# Patient Record
Sex: Male | Born: 1980 | Race: White | Hispanic: No | Marital: Single | Smoking: Current every day smoker
Health system: Southern US, Community
[De-identification: ages and names within clinical notes are randomized; demographics above are authoritative.]

## PROBLEM LIST (undated history)

## (undated) DIAGNOSIS — T50901A Poisoning by unspecified drugs, medicaments and biological substances, accidental (unintentional), initial encounter: Secondary | ICD-10-CM

## (undated) DIAGNOSIS — F1910 Other psychoactive substance abuse, uncomplicated: Secondary | ICD-10-CM

## (undated) DIAGNOSIS — F419 Anxiety disorder, unspecified: Secondary | ICD-10-CM

## (undated) DIAGNOSIS — F32A Depression, unspecified: Secondary | ICD-10-CM

## (undated) DIAGNOSIS — F329 Major depressive disorder, single episode, unspecified: Secondary | ICD-10-CM

## (undated) SURGERY — Surgical Case
Anesthesia: *Unknown

---

## 2010-03-28 ENCOUNTER — Emergency Department (HOSPITAL_COMMUNITY): Admission: EM | Admit: 2010-03-28 | Discharge: 2010-03-28 | Payer: Self-pay | Admitting: Emergency Medicine

## 2011-08-25 ENCOUNTER — Encounter (HOSPITAL_COMMUNITY): Payer: Self-pay

## 2011-08-25 ENCOUNTER — Emergency Department (HOSPITAL_COMMUNITY)
Admission: EM | Admit: 2011-08-25 | Discharge: 2011-08-25 | Disposition: A | Discharge status: Home / Self Care | Payer: Self-pay | Attending: Emergency Medicine | Admitting: Emergency Medicine

## 2011-08-25 DIAGNOSIS — K029 Dental caries, unspecified: Secondary | ICD-10-CM | POA: Insufficient documentation

## 2011-08-25 DIAGNOSIS — K0889 Other specified disorders of teeth and supporting structures: Secondary | ICD-10-CM

## 2011-08-25 DIAGNOSIS — K089 Disorder of teeth and supporting structures, unspecified: Secondary | ICD-10-CM | POA: Insufficient documentation

## 2011-08-25 MED ORDER — PENICILLIN V POTASSIUM 250 MG PO TABS
500.0000 mg | ORAL_TABLET | Freq: Once | ORAL | Status: AC
  Administered 2011-08-25: 500 mg via ORAL
  Filled 2011-08-25: qty 2

## 2011-08-25 MED ORDER — PENICILLIN V POTASSIUM 500 MG PO TABS: 500.0000 mg | ORAL_TABLET | Freq: Four times a day (QID) | ORAL | Status: AC

## 2011-08-25 MED ORDER — HYDROCODONE-ACETAMINOPHEN 5-325 MG PO TABS: ORAL_TABLET | ORAL | Status: AC

## 2011-08-25 MED ORDER — OXYCODONE-ACETAMINOPHEN 5-325 MG PO TABS
1.0000 | ORAL_TABLET | Freq: Once | ORAL | Status: AC
  Administered 2011-08-25: 1 via ORAL
  Filled 2011-08-25: qty 1

## 2011-08-25 NOTE — ED Provider Notes (Signed)
History     CSN: [id number]  Arrival date & time 08/25/11  0016   First MD Initiated Contact with Patient 08/25/11 0034      Chief Complaint  Patient presents with  . Dental Pain    (Consider location/radiation/quality/duration/timing/severity/associated sxs/prior treatment) HPI Comments: Patient complains of worsening dental pain for 3-4 days. States the pain is keeping him from sleep. Pain is worse to the upper left front teeth.  He denies fever, neck pain, headache or dizziness.    Patient is a 31 y.o. male presenting with tooth pain. The history is provided by the patient. No language interpreter was used.  Dental PainThe primary symptoms include mouth pain and headaches. Primary symptoms do not include dental injury, oral bleeding, fever, shortness of breath, sore throat, angioedema or cough. The symptoms began 3 to 5 days ago. The symptoms are worsening. The symptoms are recurrent. The symptoms occur constantly.  Mouth pain occurs constantly. Mouth pain is worsening. Affected locations include: gum(s) and teeth. The mouth pain is currently at 10/10.  The headache is not associated with neck stiffness or weakness.   Additional symptoms include: dental sensitivity to temperature, gum swelling, gum tenderness, ear pain and swollen glands. Additional symptoms do not include: purulent gums, trismus, facial swelling, trouble swallowing and pain with swallowing. Medical issues include: smoking and periodontal disease.    History reviewed. No pertinent past medical history.  History reviewed. No pertinent past surgical history.  History reviewed. No pertinent family history.  History  Substance Use Topics  . Smoking status: Current Everyday Smoker  . Smokeless tobacco: Not on file  . Alcohol Use: No      Review of Systems  Constitutional: Negative for fever, activity change and appetite change.  HENT: Positive for ear pain and dental problem. Negative for sore throat, facial  swelling, trouble swallowing, neck pain and neck stiffness.   Respiratory: Negative for cough and shortness of breath.   Gastrointestinal: Negative for vomiting.  Musculoskeletal: Negative.   Skin: Negative.   Neurological: Positive for headaches. Negative for dizziness and weakness.  Hematological: Positive for adenopathy.  All other systems reviewed and are negative.    Allergies  Review of patient's allergies indicates no known allergies.  Home Medications   Current Outpatient Rx  Name Route Sig Dispense Refill  . IBUPROFEN 800 MG PO TABS Oral Take 800 mg by mouth every 8 (eight) hours as needed.      BP 159/80  Pulse 115  Temp(Src) 97.9 F (36.6 C) (Oral)  Resp 14  SpO2 100%  Physical Exam  Nursing note and vitals reviewed. Constitutional: He is oriented to person, place, and time. He appears well-developed and well-nourished. No distress.  HENT:  Head: Normocephalic and atraumatic. No trismus in the jaw.  Mouth/Throat: Uvula is midline, oropharynx is clear and moist and mucous membranes are normal. He does not have dentures. No oral lesions. Abnormal dentition. Dental caries present. No dental abscesses, uvula swelling or lacerations. No oropharyngeal exudate.         Widespread dental disease and multiple dental caries of the upper teeth.  No obvious abscess.  Eyes: EOM are normal. Pupils are equal, round, and reactive to light.  Neck: Normal range of motion. Neck supple. No thyromegaly present.  Cardiovascular: Normal rate, regular rhythm and normal heart sounds.   Pulmonary/Chest: Effort normal and breath sounds normal.  Lymphadenopathy:    He has no cervical adenopathy.  Neurological: He is alert and oriented to person, place, and  time.  Skin: Skin is warm and dry.    ED Course  Procedures (including critical care time)       MDM    Patient has multiple dental caries. Widespread dental decay.  No obvious dental abscess, no trismus.  Non-toxic  appearing.  He agrees to close f/u with a dentist.        Arlyn Bumpus L. Amorie Rentz, PA 08/25/11 0100

## 2011-08-25 NOTE — ED Notes (Signed)
Pt stable at discharge with family discussed community resources

## 2011-08-25 NOTE — ED Notes (Signed)
Pt states pain bilateral sides left side worst than right side; pain radiates up to forehead causing a margine headache. Pain increased this pm. Pt has not been able to sleep in 48 hours. Pt states is going to seek dental treatment next month

## 2011-08-25 NOTE — ED Notes (Signed)
Multiple dental decay, states more painful to right upper, is taking ibuprofen for same without relief

## 2011-08-25 NOTE — ED Provider Notes (Signed)
Medical screening examination/treatment/procedure(s) were performed by non-physician practitioner and as supervising physician I was immediately available for consultation/collaboration.  Donnetta Hutching, MD 08/25/11 [phone]

## 2013-03-05 ENCOUNTER — Emergency Department (HOSPITAL_COMMUNITY)
Admission: EM | Admit: 2013-03-05 | Discharge: 2013-03-05 | Disposition: A | Discharge status: Home / Self Care | Payer: Self-pay | Attending: Emergency Medicine | Admitting: Emergency Medicine

## 2013-03-05 ENCOUNTER — Encounter (HOSPITAL_COMMUNITY): Payer: Self-pay | Admitting: *Deleted

## 2013-03-05 DIAGNOSIS — H53149 Visual discomfort, unspecified: Secondary | ICD-10-CM | POA: Insufficient documentation

## 2013-03-05 DIAGNOSIS — F17200 Nicotine dependence, unspecified, uncomplicated: Secondary | ICD-10-CM | POA: Insufficient documentation

## 2013-03-05 DIAGNOSIS — H16001 Unspecified corneal ulcer, right eye: Secondary | ICD-10-CM

## 2013-03-05 DIAGNOSIS — H16009 Unspecified corneal ulcer, unspecified eye: Secondary | ICD-10-CM | POA: Insufficient documentation

## 2013-03-05 DIAGNOSIS — K029 Dental caries, unspecified: Secondary | ICD-10-CM | POA: Insufficient documentation

## 2013-03-05 DIAGNOSIS — H5789 Other specified disorders of eye and adnexa: Secondary | ICD-10-CM | POA: Insufficient documentation

## 2013-03-05 DIAGNOSIS — H539 Unspecified visual disturbance: Secondary | ICD-10-CM | POA: Insufficient documentation

## 2013-03-05 MED ORDER — CYCLOPENTOLATE HCL 1 % OP SOLN
1.0000 [drp] | Freq: Once | OPHTHALMIC | Status: DC
  Filled 2013-03-05: qty 2

## 2013-03-05 MED ORDER — GATIFLOXACIN 0.5 % OP SOLN
1.0000 [drp] | Freq: Once | OPHTHALMIC | Status: AC
  Administered 2013-03-05: 1 [drp] via OPHTHALMIC
  Filled 2013-03-05: qty 2.5

## 2013-03-05 MED ORDER — CYCLOPENTOLATE HCL 1 % OP SOLN: 1.0000 [drp] | Freq: Four times a day (QID) | OPHTHALMIC | Status: DC

## 2013-03-05 MED ORDER — CYCLOPENTOLATE-PHENYLEPHRINE 0.2-1 % OP SOLN
OPHTHALMIC | Status: AC
  Filled 2013-03-05: qty 2

## 2013-03-05 MED ORDER — AMOXICILLIN 500 MG PO CAPS: 500.0000 mg | ORAL_CAPSULE | Freq: Three times a day (TID) | ORAL | Status: DC

## 2013-03-05 MED ORDER — KETOROLAC TROMETHAMINE 0.5 % OP SOLN
1.0000 [drp] | Freq: Once | OPHTHALMIC | Status: AC
  Administered 2013-03-05: 1 [drp] via OPHTHALMIC
  Filled 2013-03-05: qty 5

## 2013-03-05 MED ORDER — GATIFLOXACIN 0.5 % OP SOLN
OPHTHALMIC | Status: AC
  Filled 2013-03-05: qty 2.5

## 2013-03-05 MED ORDER — TETRACAINE HCL 0.5 % OP SOLN
2.0000 [drp] | Freq: Once | OPHTHALMIC | Status: AC
  Administered 2013-03-05: 2 [drp] via OPHTHALMIC
  Filled 2013-03-05: qty 2

## 2013-03-05 NOTE — ED Provider Notes (Signed)
Jeffrey Schultz is a 32 y.o. male here for evaluation of right eye discomfort. He had a contact in his right eye that has been present for 9 months, he forgotten about. He feels better after he removed it, in emergency department. After removal of the contact, is a small area of uptake and 8:00 in the periphery of the cornea of the right eye. Is mild scleral injection. Pupillary function is normal and there is no hyphema.  Assessment: Prolonged contact exposure right eye. Possible small corneal ulcer. It may improve simply with simply having removed the contact. We will contact an ophthalmologist for guidance in treatment, and to arrange followup care.  Medical screening examination/treatment/procedure(s) were conducted as a shared visit with non-physician practitioner(s) and myself.  I personally evaluated the patient during the encounter  Flint Melter, MD 03/09/13 [phone]

## 2013-03-05 NOTE — ED Notes (Signed)
Pt c/o abscess-like area to roof of mouth as well as headache and right eye pain. Pt states he noticed the area on the roof of his mouth approximately 1 week ago. Pt states headache and eye pain began yesterday. Pt reports blurry vision and burning pain in right eye.

## 2013-03-05 NOTE — ED Notes (Signed)
Pt does have contact in lt eye. Rajvi Armentor

## 2013-03-05 NOTE — ED Provider Notes (Signed)
CSN: [id number]     Arrival date & time 03/05/13  1528 History     First MD Initiated Contact with Patient 03/05/13 1631     Chief Complaint  Patient presents with  . Eye Pain   (Consider location/radiation/quality/duration/timing/severity/associated sxs/prior Treatment) Patient is a 32 y.o. male presenting with eye pain. The history is provided by the patient.  Eye Pain This is a new problem. The current episode started in the past 7 days. The problem occurs constantly. The problem has been gradually worsening. Associated symptoms include a visual change. Pertinent negatives include no abdominal pain, chest pain, chills, congestion, coughing, fever, headaches, myalgias, nausea, neck pain, numbness, rash, sore throat, swollen glands, urinary symptoms, vertigo, vomiting or weakness. Associated symptoms comments: Dental pain for several days. Exacerbated by: bright lights. He has tried nothing for the symptoms. The treatment provided no relief.    History reviewed. No pertinent past medical history. History reviewed. No pertinent past surgical history. No family history on file. History  Substance Use Topics  . Smoking status: Current Every Day Smoker  . Smokeless tobacco: Not on file  . Alcohol Use: No    Review of Systems  Constitutional: Negative for fever, chills and appetite change.  HENT: Positive for dental problem. Negative for congestion, sore throat, facial swelling, trouble swallowing, neck pain and neck stiffness.   Eyes: Positive for photophobia, pain, redness and visual disturbance. Negative for discharge and itching.  Respiratory: Negative for cough.   Cardiovascular: Negative for chest pain.  Gastrointestinal: Negative for nausea, vomiting and abdominal pain.  Musculoskeletal: Negative for myalgias.  Skin: Negative for rash.  Neurological: Negative for dizziness, vertigo, facial asymmetry, weakness, numbness and headaches.  Hematological: Negative for adenopathy.   All other systems reviewed and are negative.    Allergies  Review of patient's allergies indicates no known allergies.  Home Medications   Current Outpatient Rx  Name  Route  Sig  Dispense  Refill  . ibuprofen (ADVIL,MOTRIN) 200 MG tablet   Oral   Take 800 mg by mouth every 6 (six) hours as needed for pain.          BP 156/90  Pulse 75  Temp(Src) 98.2 F (36.8 C) (Oral)  Resp 20  Ht 5\' 8"  (1.727 m)  Wt 155 lb (70.308 kg)  BMI 23.57 kg/m2  SpO2 100% Physical Exam  Nursing note and vitals reviewed. Constitutional: He is oriented to person, place, and time. He appears well-developed and well-nourished. No distress.  HENT:  Head: Normocephalic and atraumatic. No trismus in the jaw.  Right Ear: Tympanic membrane and ear canal normal.  Left Ear: Tympanic membrane and ear canal normal.  Mouth/Throat: Uvula is midline, oropharynx is clear and moist and mucous membranes are normal. Dental caries present. No dental abscesses or edematous.  Multiple dental caries of the upper teeth.  Most teeth have decayed to the gumline.  No obvious dental abscess, trismus or facial edema.  Eyes: EOM are normal. Pupils are equal, round, and reactive to light. No foreign bodies found. Left eye exhibits no chemosis, no discharge, no exudate and no hordeolum. Left conjunctiva is injected.  Slit lamp exam:      The right eye shows fluorescein uptake.       The left eye shows corneal ulcer. The left eye shows no corneal abrasion and no hyphema.  fluorescein uptake of the entire cornea.  Very distinct margins surrounding the iris that appears c/w the presence of a contact lens.  Once  contact was removed, pt has a small corneal ulcer to the 12 o'clock position.    Neck: Normal range of motion. Neck supple.  Cardiovascular: Normal rate, regular rhythm and normal heart sounds.   No murmur heard. Pulmonary/Chest: Effort normal and breath sounds normal.  Musculoskeletal: Normal range of motion.   Lymphadenopathy:    He has no cervical adenopathy.  Neurological: He is alert and oriented to person, place, and time. He exhibits normal muscle tone. Coordination normal.  Skin: Skin is warm and dry.    ED Course   Procedures (including critical care time)  Labs Reviewed - No data to display No results found. No diagnosis found.  MDM   Visual acuity attempted prior to my exam, pt unable to visualize anything in the right eye due to pain and could not keep his eye open.   Pt also seen by the EDP and care plan discussed.  1850  Consulted Dr. Lita Mains.  Recommends acular, cylogyl, and gatifiloxacin.  Will see tomorrow in his office for recheck.  Pt is feeling better and appears stable for d/c and agrees to the care plan and verbalized understanding  Christinea Brizuela L. Jerrika Ledlow, PA-C 03/09/13 1600

## 2013-03-05 NOTE — ED Notes (Signed)
States he has an abscess in right side of mouth, states yesterday his right eye started hurting, today he cannot see out of eye due to pain , sclera is red

## 2013-03-06 NOTE — Progress Notes (Signed)
   CARE MANAGEMENT ED NOTE 03/06/2013  Patient:  Jeffrey Schultz, Jeffrey Schultz   Account Number:  [id number]  Date Initiated:  03/05/2013  Documentation initiated by:  Anibal Henderson  Subjective/Objective Assessment:   ED/CM noted pt did not have PCP, nor insurance     Subjective/Objective Assessment Detail:   Spoke with pt and he confirmed no PCP, no insurance. Handouts give with [facility] information for clinics, food pantries, and information on obtaining insurance     Action/Plan:   Action/Plan Detail:   Anticipated DC Date:  03/05/2013     Status Recommendation to Physician:   Result of Recommendation:        Choice offered to / List presented to:            Status of service:    ED Comments:   ED Comments Detail:     CARE MANAGEMENT ED NOTE 03/06/2013  Patient:  Jeffrey Schultz, Jeffrey Schultz   Account Number:  [id number]  Date Initiated:  03/05/2013  Documentation initiated by:  Anibal Henderson  Subjective/Objective Assessment:   ED/CM noted pt did not have PCP, nor insurance     Subjective/Objective Assessment Detail:   Spoke with pt and he confirmed no PCP, no insurance. Handouts give with [facility] information for clinics, food pantries, and information on obtaining insurance     Action/Plan:   Action/Plan Detail:   Anticipated DC Date:  03/05/2013     Status Recommendation to Physician:   Result of Recommendation:        Choice offered to / List presented to:            Status of service:    ED Comments:   ED Comments Detail:

## 2013-03-09 NOTE — ED Provider Notes (Signed)
Medical screening examination/treatment/procedure(s) were performed by non-physician practitioner and as supervising physician I was immediately available for consultation/collaboration.  Flint Melter, MD 03/09/13 Zollie Pee

## 2014-04-22 ENCOUNTER — Emergency Department (HOSPITAL_COMMUNITY)
Admission: EM | Admit: 2014-04-22 | Discharge: 2014-04-22 | Disposition: A | Discharge status: Home / Self Care | Payer: Self-pay | Attending: Emergency Medicine | Admitting: Emergency Medicine

## 2014-04-22 ENCOUNTER — Encounter (HOSPITAL_COMMUNITY): Payer: Self-pay | Admitting: Emergency Medicine

## 2014-04-22 DIAGNOSIS — K006 Disturbances in tooth eruption: Secondary | ICD-10-CM | POA: Insufficient documentation

## 2014-04-22 DIAGNOSIS — F17200 Nicotine dependence, unspecified, uncomplicated: Secondary | ICD-10-CM | POA: Insufficient documentation

## 2014-04-22 DIAGNOSIS — K089 Disorder of teeth and supporting structures, unspecified: Secondary | ICD-10-CM | POA: Insufficient documentation

## 2014-04-22 DIAGNOSIS — Z79899 Other long term (current) drug therapy: Secondary | ICD-10-CM | POA: Insufficient documentation

## 2014-04-22 DIAGNOSIS — R599 Enlarged lymph nodes, unspecified: Secondary | ICD-10-CM | POA: Insufficient documentation

## 2014-04-22 DIAGNOSIS — R51 Headache: Secondary | ICD-10-CM | POA: Insufficient documentation

## 2014-04-22 DIAGNOSIS — K029 Dental caries, unspecified: Secondary | ICD-10-CM | POA: Insufficient documentation

## 2014-04-22 DIAGNOSIS — Z792 Long term (current) use of antibiotics: Secondary | ICD-10-CM | POA: Insufficient documentation

## 2014-04-22 DIAGNOSIS — R110 Nausea: Secondary | ICD-10-CM | POA: Insufficient documentation

## 2014-04-22 MED ORDER — ACETAMINOPHEN-CODEINE #3 300-30 MG PO TABS: 1.0000 | ORAL_TABLET | Freq: Four times a day (QID) | ORAL | Status: DC | PRN

## 2014-04-22 MED ORDER — CEFTRIAXONE SODIUM 1 G IJ SOLR
1.0000 g | Freq: Once | INTRAMUSCULAR | Status: AC
  Administered 2014-04-22: 1 g via INTRAMUSCULAR
  Filled 2014-04-22: qty 10

## 2014-04-22 MED ORDER — CLINDAMYCIN HCL 150 MG PO CAPS: ORAL_CAPSULE | ORAL | Status: DC

## 2014-04-22 MED ORDER — ACETAMINOPHEN-CODEINE #3 300-30 MG PO TABS
2.0000 | ORAL_TABLET | Freq: Once | ORAL | Status: AC
  Administered 2014-04-22: 2 via ORAL
  Filled 2014-04-22: qty 2

## 2014-04-22 MED ORDER — LIDOCAINE HCL (PF) 1 % IJ SOLN
INTRAMUSCULAR | Status: AC
  Filled 2014-04-22: qty 5

## 2014-04-22 MED ORDER — ONDANSETRON 4 MG PO TBDP
4.0000 mg | ORAL_TABLET | Freq: Once | ORAL | Status: AC
  Administered 2014-04-22: 4 mg via ORAL
  Filled 2014-04-22: qty 1

## 2014-04-22 NOTE — Discharge Instructions (Signed)
Advanced dental disease and multiple infected teeth. It is extremely important that you see a dentist as sone as possible. Please use clindamycin 4 times daily with each meal and at bedtime. Use Tylenol for mild pain, use Tylenol codeine for more severe pain. Tylenol codeine may cause drowsiness, please use with caution. Dental Caries Dental caries (also called tooth decay) is the most common oral disease. It can occur at any age but is more common in children and young adults.  HOW DENTAL CARIES DEVELOPS  The process of decay begins when bacteria and foods (particularly sugars and starches) combine in your mouth to produce plaque. Plaque is a substance that sticks to the hard, outer surface of a tooth (enamel). The bacteria in plaque produce acids that attack enamel. These acids may also attack the root surface of a tooth (cementum) if it is exposed. Repeated attacks dissolve these surfaces and create holes in the tooth (cavities). If left untreated, the acids destroy the other layers of the tooth.  RISK FACTORS  Frequent sipping of sugary beverages.   Frequent snacking on sugary and starchy foods, especially those that easily get stuck in the teeth.   Poor oral hygiene.   Dry mouth.   Substance abuse such as methamphetamine abuse.   Broken or poor-fitting dental restorations.   Eating disorders.   Gastroesophageal reflux disease (GERD).   Certain radiation treatments to the head and neck. SYMPTOMS In the early stages of dental caries, symptoms are seldom present. Sometimes white, chalky areas may be seen on the enamel or other tooth layers. In later stages, symptoms may include:  Pits and holes on the enamel.  Toothache after sweet, hot, or cold foods or drinks are consumed.  Pain around the tooth.  Swelling around the tooth. DIAGNOSIS  Most of the time, dental caries is detected during a regular dental checkup. A diagnosis is made after a thorough medical and dental  history is taken and the surfaces of your teeth are checked for signs of dental caries. Sometimes special instruments, such as lasers, are used to check for dental caries. Dental X-ray exams may be taken so that areas not visible to the eye (such as between the contact areas of the teeth) can be checked for cavities.  TREATMENT  If dental caries is in its early stages, it may be reversed with a fluoride treatment or an application of a remineralizing agent at the dental office. Thorough brushing and flossing at home is needed to aid these treatments. If it is in its later stages, treatment depends on the location and extent of tooth destruction:   If a small area of the tooth has been destroyed, the destroyed area will be removed and cavities will be filled with a material such as gold, silver amalgam, or composite resin.   If a large area of the tooth has been destroyed, the destroyed area will be removed and a cap (crown) will be fitted over the remaining tooth structure.   If the center part of the tooth (pulp) is affected, a procedure called a root canal will be needed before a filling or crown can be placed.   If most of the tooth has been destroyed, the tooth may need to be pulled (extracted). HOME CARE INSTRUCTIONS You can prevent, stop, or reverse dental caries at home by practicing good oral hygiene. Good oral hygiene includes:  Thoroughly cleaning your teeth at least twice a day with a toothbrush and dental floss.   Using a fluoride  toothpaste. A fluoride mouth rinse may also be used if recommended by your dentist or health care provider.   Restricting the amount of sugary and starchy foods and sugary liquids you consume.   Avoiding frequent snacking on these foods and sipping of these liquids.   Keeping regular visits with a dentist for checkups and cleanings. PREVENTION   Practice good oral hygiene.  Consider a dental sealant. A dental sealant is a coating material that  is applied by your dentist to the pits and grooves of teeth. The sealant prevents food from being trapped in them. It may protect the teeth for several years.  Ask about fluoride supplements if you live in a community without fluorinated water or with water that has a low fluoride content. Use fluoride supplements as directed by your dentist or health care provider.  Allow fluoride varnish applications to teeth if directed by your dentist or health care provider. Document Released: 04/09/2002 Document Revised: 12/02/2013 Document Reviewed: 07/20/2012 [facility] Patient Information 2015 [place], [place]. This information is not intended to replace advice given to you by your health care provider. Make sure you discuss any questions you have with your health care provider.

## 2014-04-22 NOTE — ED Notes (Signed)
Pt verbalized understanding of no driving and to use caution after using pain meds due to med causes drowsiness

## 2014-04-22 NOTE — ED Notes (Signed)
Dental pain for 3 days,

## 2014-04-22 NOTE — ED Provider Notes (Signed)
CSN: [id number]     Arrival date & time 04/22/14  2003 History   First MD Initiated Contact with Patient 04/22/14 2022     Chief Complaint  Patient presents with  . Dental Pain     (Consider location/radiation/quality/duration/timing/severity/associated sxs/prior Treatment) Patient is a 33 y.o. male presenting with tooth pain. The history is provided by the patient.  Dental Pain Location:  Generalized Quality:  Throbbing and aching Severity:  Severe Onset quality:  Gradual Duration:  3 days Timing:  Intermittent Progression:  Worsening Chronicity:  Chronic Context: dental caries and poor dentition   Context: not trauma   Relieved by:  Nothing Ineffective treatments:  Acetaminophen Associated symptoms: facial pain, gum swelling and headaches   Associated symptoms: no fever and no neck pain   Associated symptoms comment:  Nausea Risk factors: lack of dental care, periodontal disease and smoking   Risk factors: no diabetes     History reviewed. No pertinent past medical history. History reviewed. No pertinent past surgical history. History reviewed. No pertinent family history. History  Substance Use Topics  . Smoking status: Current Every Day Smoker  . Smokeless tobacco: Not on file  . Alcohol Use: No    Review of Systems  Constitutional: Negative for fever and activity change.       All ROS Neg except as noted in HPI  HENT: Positive for dental problem.   Eyes: Negative for photophobia and discharge.  Respiratory: Negative for cough, shortness of breath and wheezing.   Cardiovascular: Negative for chest pain and palpitations.  Gastrointestinal: Positive for nausea. Negative for abdominal pain and blood in stool.  Genitourinary: Negative for dysuria, frequency and hematuria.  Musculoskeletal: Negative for arthralgias, back pain and neck pain.  Skin: Negative.   Neurological: Positive for headaches. Negative for dizziness, seizures and speech difficulty.   Psychiatric/Behavioral: Negative for hallucinations and confusion.      Allergies  Ibuprofen  Home Medications   Prior to Admission medications   Medication Sig Start Date End Date Taking? Authorizing Provider  amoxicillin (AMOXIL) 500 MG capsule Take 1 capsule (500 mg total) by mouth 3 (three) times daily. 03/05/13   Tammy L. Triplett, PA-C  cyclopentolate (CYCLOGYL) 1 % ophthalmic solution Apply 1 drop to eye QID. 03/05/13   Tammy L. Triplett, PA-C  ibuprofen (ADVIL,MOTRIN) 200 MG tablet Take 800 mg by mouth every 6 (six) hours as needed for pain.    Historical Provider, MD   BP 127/90  Pulse 117  Temp(Src) 98.2 F (36.8 C) (Oral)  Resp 18  Ht  (1.727 m)  Wt 160 lb (72.576 kg)  BMI 24.33 kg/m2  SpO2 99% Physical Exam  Nursing note and vitals reviewed. Constitutional: He is oriented to person, place, and time. He appears well-developed and well-nourished.  Non-toxic appearance.  HENT:  Head: Normocephalic.  Right Ear: Tympanic membrane and external ear normal.  Left Ear: Tympanic membrane and external ear normal.  There multiple dental caries decayed to the gum line of the upper and lower jaw. There is swelling of the gums of the upper and lower jaw. There is no swelling of the tongue. The airway is patent. There is no swelling under the tongue.  Eyes: EOM and lids are normal. Pupils are equal, round, and reactive to light.  Neck: Normal range of motion. Neck supple. Carotid bruit is not present.  Cardiovascular: Normal rate, regular rhythm, normal heart sounds, intact distal pulses and normal pulses.   Pulmonary/Chest: Breath sounds normal. No respiratory  distress.  Abdominal: Soft. Bowel sounds are normal. There is no tenderness. There is no guarding.  Musculoskeletal: Normal range of motion.  Lymphadenopathy:       Head (right side): No submandibular adenopathy present.       Head (left side): No submandibular adenopathy present.    He has cervical adenopathy.   Neurological: He is alert and oriented to person, place, and time. He has normal strength. No cranial nerve deficit or sensory deficit.  Skin: Skin is warm and dry.  Psychiatric: He has a normal mood and affect. His speech is normal.    ED Course  Procedures (including critical care time) Labs Review Labs Reviewed - No data to display  Imaging Review No results found.   EKG Interpretation None      MDM  Patient has rather severe dental problems. Multiple cavities, multiple teeth decayed to the gum line, and advanced gum disease. Vital signs are within normal limits with exception of the pulse being elevated at 117. Have given the patient undergo resources and information, including the number to the [employer]. Rx for clindamycin and tylenol codeine given to the patient.   Final diagnoses:  None    **I have reviewed nursing notes, vital signs, and all appropriate lab and imaging results for this patient.Kathie Dike, PA-C 04/22/14 2105

## 2014-04-25 NOTE — ED Provider Notes (Signed)
Medical screening examination/treatment/procedure(s) were performed by non-physician practitioner and as supervising physician I was immediately available for consultation/collaboration.   EKG Interpretation None       Donnetta Hutching, MD 04/25/14 2010

## 2015-03-14 ENCOUNTER — Emergency Department (HOSPITAL_COMMUNITY)
Admission: EM | Admit: 2015-03-14 | Discharge: 2015-03-14 | Disposition: A | Discharge status: Home / Self Care | Payer: Self-pay | Attending: Emergency Medicine | Admitting: Emergency Medicine

## 2015-03-14 ENCOUNTER — Encounter (HOSPITAL_COMMUNITY): Payer: Self-pay

## 2015-03-14 DIAGNOSIS — Z79899 Other long term (current) drug therapy: Secondary | ICD-10-CM | POA: Insufficient documentation

## 2015-03-14 DIAGNOSIS — Z720 Tobacco use: Secondary | ICD-10-CM | POA: Insufficient documentation

## 2015-03-14 DIAGNOSIS — K047 Periapical abscess without sinus: Secondary | ICD-10-CM | POA: Insufficient documentation

## 2015-03-14 DIAGNOSIS — L120 Bullous pemphigoid: Secondary | ICD-10-CM | POA: Insufficient documentation

## 2015-03-14 DIAGNOSIS — Z792 Long term (current) use of antibiotics: Secondary | ICD-10-CM | POA: Insufficient documentation

## 2015-03-14 MED ORDER — PENICILLIN V POTASSIUM 250 MG PO TABS: 250.0000 mg | ORAL_TABLET | Freq: Four times a day (QID) | ORAL | Status: AC

## 2015-03-14 MED ORDER — TRAMADOL HCL 50 MG PO TABS: 50.0000 mg | ORAL_TABLET | Freq: Four times a day (QID) | ORAL | Status: DC | PRN

## 2015-03-14 MED ORDER — HYDROCORTISONE 0.5 % EX OINT: 1.0000 "application " | TOPICAL_OINTMENT | Freq: Two times a day (BID) | CUTANEOUS | Status: DC

## 2015-03-14 MED ORDER — HYDROCODONE-ACETAMINOPHEN 5-325 MG PO TABS
1.0000 | ORAL_TABLET | Freq: Once | ORAL | Status: AC
  Administered 2015-03-14: 1 via ORAL
  Filled 2015-03-14: qty 1

## 2015-03-14 MED ORDER — IBUPROFEN 400 MG PO TABS
600.0000 mg | ORAL_TABLET | Freq: Once | ORAL | Status: AC
  Administered 2015-03-14: 600 mg via ORAL
  Filled 2015-03-14: qty 2

## 2015-03-14 MED ORDER — IBUPROFEN 600 MG PO TABS: 600.0000 mg | ORAL_TABLET | Freq: Four times a day (QID) | ORAL | Status: DC | PRN

## 2015-03-14 NOTE — ED Provider Notes (Signed)
CSN: [id number]     Arrival date & time 03/14/15  5621 History   First MD Initiated Contact with Patient 03/14/15 [phone]     Chief Complaint  Patient presents with  . Dental Pain     (Consider location/radiation/quality/duration/timing/severity/associated sxs/prior Treatment) HPI Comments: Pt comes in with right upper quadrant toothache and skin rash. He reports hx of poor dentition and started having toothache 2 days ago. The pain is moderately severe and is radiating towards his ear. There is no neck pain, headaches, neck stiffness, fevers, chills. He has hx of dental abscess. He also reports skin rash to the hand. Pt started having blisters 3 months ago, and since then he has had persistent new lesions and old lesions breaking. No hx of skin dz, autoimmune condition.   ROS 10 Systems reviewed and are negative for acute change except as noted in the HPI.     Patient is a 34 y.o. male presenting with tooth pain. The history is provided by the patient.  Dental Pain Associated symptoms: no drooling, no facial swelling and no neck pain     History reviewed. No pertinent past medical history. History reviewed. No pertinent past surgical history. No family history on file. Social History  Substance Use Topics  . Smoking status: Current Every Day Smoker  . Smokeless tobacco: None  . Alcohol Use: No    Review of Systems  HENT: Negative for drooling and facial swelling.   Musculoskeletal: Negative for myalgias, joint swelling, arthralgias and neck pain.  Skin: Positive for rash.  Allergic/Immunologic: Negative for immunocompromised state.  All other systems reviewed and are negative.     Allergies  Review of patient's allergies indicates no active allergies.  Home Medications   Prior to Admission medications   Medication Sig Start Date End Date Taking? Authorizing Provider  acetaminophen-codeine (TYLENOL #3) 300-30 MG per tablet Take 1-2 tablets by mouth every 6 (six)  hours as needed. 04/22/14  Yes Ivery Quale, PA-C  amoxicillin (AMOXIL) 500 MG capsule Take 1 capsule (500 mg total) by mouth 3 (three) times daily. 03/05/13   Tammy Triplett, PA-C  clindamycin (CLEOCIN) 150 MG capsule 1 po ac and hs, qid 04/22/14   Ivery Quale, PA-C  cyclopentolate (CYCLOGYL) 1 % ophthalmic solution Apply 1 drop to eye QID. 03/05/13   Tammy Triplett, PA-C  hydrocortisone ointment 0.5 % Apply 1 application topically 2 (two) times daily. 03/14/15   Derwood Kaplan, MD  ibuprofen (ADVIL,MOTRIN) 600 MG tablet Take 1 tablet (600 mg total) by mouth every 6 (six) hours as needed. 03/14/15   Derwood Kaplan, MD  penicillin v potassium (VEETID) 250 MG tablet Take 1 tablet (250 mg total) by mouth 4 (four) times daily. 03/14/15 03/21/15  Derwood Kaplan, MD  traMADol (ULTRAM) 50 MG tablet Take 1 tablet (50 mg total) by mouth every 6 (six) hours as needed. 03/14/15   Amirra Herling Rhunette Croft, MD   BP 129/84 mmHg  Pulse 96  Temp(Src) 98.2 F (36.8 C) (Oral)  Resp 16  Ht  (1.727 m)  Wt 150 lb (68.04 kg)  BMI 22.81 kg/m2  SpO2 98% Physical Exam  Constitutional: He is oriented to person, place, and time. He appears well-developed.  HENT:  Head: Normocephalic and atraumatic.  Poor dentition through out with majority of tooth lost. Pt has some darkening of the gingiva around the teeth in the right quadrant, no abscess, drainage, no ANUG concerns. No trismus, no nuchal rigidity, mild cervical lymphadenopathy.  Eyes: Conjunctivae and EOM are  normal. Pupils are equal, round, and reactive to light.  Neck: Normal range of motion. Neck supple.  Cardiovascular: Normal rate and regular rhythm.   Pulmonary/Chest: Effort normal and breath sounds normal.  Abdominal: Soft. Bowel sounds are normal. He exhibits no distension. There is no tenderness. There is no rebound and no guarding.  Neurological: He is alert and oriented to person, place, and time.  Skin: Skin is warm. Rash noted.  Please see the pics - pt has  blisters to the dorsal hand.  Nursing note and vitals reviewed.         ED Course  Procedures (including critical care time) Labs Review Labs Reviewed - No data to display  Imaging Review No results found. I, Muhanad Torosyan, personally reviewed and evaluated these images and lab results as part of my medical decision-making.   EKG Interpretation None      MDM   Final diagnoses:  Bullous pemphigoid  Dental infection    Pt with very poor dentition and new skin rash. Doesn't look allergic type lesion or infected. With the combination of poor dentition and the ? Pemphigoid lesions - concerns that there might be vasculitis or autoimmune condition. Pt has no insurance, no pcp. He is to see dentist next week.  Will get him antibiotics. [facility] f.u info provided and Derm info in [place] given. Will give topical steroid.   Derwood Kaplan, MD 03/14/15 [phone]

## 2015-03-14 NOTE — Discharge Instructions (Signed)
Please see the Dentist as planned on Wednesday. Please contact the Skin Specialist as soon as possible - you need to be seen by them for further evaluation of your rash.   Dental Abscess A dental abscess is a collection of infected fluid (pus) from a bacterial infection in the inner part of the tooth (pulp). It usually occurs at the end of the tooth's root.  CAUSES   Severe tooth decay.  Trauma to the tooth that allows bacteria to enter into the pulp, such as a broken or chipped tooth. SYMPTOMS   Severe pain in and around the infected tooth.  Swelling and redness around the abscessed tooth or in the mouth or face.  Tenderness.  Pus drainage.  Bad breath.  Bitter taste in the mouth.  Difficulty swallowing.  Difficulty opening the mouth.  Nausea.  Vomiting.  Chills.  Swollen neck glands. DIAGNOSIS   A medical and dental history will be taken.  An examination will be performed by tapping on the abscessed tooth.  X-rays may be taken of the tooth to identify the abscess. TREATMENT The goal of treatment is to eliminate the infection. You may be prescribed antibiotic medicine to stop the infection from spreading. A root canal may be performed to save the tooth. If the tooth cannot be saved, it may be pulled (extracted) and the abscess may be drained.  HOME CARE INSTRUCTIONS  Only take over-the-counter or prescription medicines for pain, fever, or discomfort as directed by your caregiver.  Rinse your mouth (gargle) often with salt water ( tsp salt in 8 oz [250 ml] of warm water) to relieve pain or swelling.  Do not drive after taking pain medicine (narcotics).  Do not apply heat to the outside of your face.  Return to your dentist for further treatment as directed. SEEK MEDICAL CARE IF:  Your pain is not helped by medicine.  Your pain is getting worse instead of better. SEEK IMMEDIATE MEDICAL CARE IF:  You have a fever or persistent symptoms for more than 2-3  days.  You have a fever and your symptoms suddenly get worse.  You have chills or a very bad headache.  You have problems breathing or swallowing.  You have trouble opening your mouth.  You have swelling in the neck or around the eye. Document Released: 07/18/2005 Document Revised: 04/11/2012 Document Reviewed: 10/26/2010 [facility] Patient Information 2015 [place], [place]. This information is not intended to replace advice given to you by your health care provider. Make sure you discuss any questions you have with your health care provider.  Rash A rash is a change in the color or texture of your skin. There are many different types of rashes. You may have other problems that accompany your rash. CAUSES   Infections.  Allergic reactions. This can include allergies to pets or foods.  Certain medicines.  Exposure to certain chemicals, soaps, or cosmetics.  Heat.  Exposure to poisonous plants.  Tumors, both cancerous and noncancerous. SYMPTOMS   Redness.  Scaly skin.  Itchy skin.  Dry or cracked skin.  Bumps.  Blisters.  Pain. DIAGNOSIS  Your caregiver may do a physical exam to determine what type of rash you have. A skin sample (biopsy) may be taken and examined under a microscope. TREATMENT  Treatment depends on the type of rash you have. Your caregiver may prescribe certain medicines. For serious conditions, you may need to see a skin doctor (dermatologist). HOME CARE INSTRUCTIONS   Avoid the substance that caused your  rash.  Do not scratch your rash. This can cause infection.  You may take cool baths to help stop itching.  Only take over-the-counter or prescription medicines as directed by your caregiver.  Keep all follow-up appointments as directed by your caregiver. SEEK IMMEDIATE MEDICAL CARE IF:  You have increasing pain, swelling, or redness.  You have a fever.  You have new or severe symptoms.  You have body aches, diarrhea, or  vomiting.  Your rash is not better after 3 days. MAKE SURE YOU:  Understand these instructions.  Will watch your condition.  Will get help right away if you are not doing well or get worse. Document Released: 07/08/2002 Document Revised: 10/10/2011 Document Reviewed: 05/02/2011 [facility] Patient Information 2015 [place], [place]. This information is not intended to replace advice given to you by your health care provider. Make sure you discuss any questions you have with your health care provider.

## 2015-03-14 NOTE — ED Notes (Signed)
Pain to right upper teeth, states he has had an abscess before and thinks he may have another one starting. Pt also requests to have rash on his hands looked at.

## 2015-04-04 ENCOUNTER — Encounter (HOSPITAL_COMMUNITY): Payer: Self-pay | Admitting: Emergency Medicine

## 2015-04-04 ENCOUNTER — Emergency Department (HOSPITAL_COMMUNITY)
Admission: EM | Admit: 2015-04-04 | Discharge: 2015-04-05 | Discharge status: Against Medical Advice | Payer: Self-pay | Attending: Emergency Medicine | Admitting: Emergency Medicine

## 2015-04-04 DIAGNOSIS — Z7952 Long term (current) use of systemic steroids: Secondary | ICD-10-CM | POA: Insufficient documentation

## 2015-04-04 DIAGNOSIS — S3992XA Unspecified injury of lower back, initial encounter: Secondary | ICD-10-CM | POA: Insufficient documentation

## 2015-04-04 DIAGNOSIS — Z720 Tobacco use: Secondary | ICD-10-CM | POA: Insufficient documentation

## 2015-04-04 DIAGNOSIS — S0990XA Unspecified injury of head, initial encounter: Secondary | ICD-10-CM | POA: Insufficient documentation

## 2015-04-04 DIAGNOSIS — W11XXXA Fall on and from ladder, initial encounter: Secondary | ICD-10-CM | POA: Insufficient documentation

## 2015-04-04 DIAGNOSIS — S199XXA Unspecified injury of neck, initial encounter: Secondary | ICD-10-CM | POA: Insufficient documentation

## 2015-04-04 DIAGNOSIS — Z792 Long term (current) use of antibiotics: Secondary | ICD-10-CM | POA: Insufficient documentation

## 2015-04-04 DIAGNOSIS — Y9389 Activity, other specified: Secondary | ICD-10-CM | POA: Insufficient documentation

## 2015-04-04 DIAGNOSIS — Y9289 Other specified places as the place of occurrence of the external cause: Secondary | ICD-10-CM | POA: Insufficient documentation

## 2015-04-04 DIAGNOSIS — Y998 Other external cause status: Secondary | ICD-10-CM | POA: Insufficient documentation

## 2015-04-04 NOTE — ED Notes (Signed)
   04/04/15 2334  Musculoskeletal  Musculoskeletal (WDL) X  Posterior/Spine  Sacral Tenderness  pt reports falling off a ladder on Friday landing on back. Denies LOC. Has had a headache. Lower back pain and tingling sometimes down both legs

## 2015-04-04 NOTE — ED Notes (Addendum)
Patient reports fell from ladder about 13 feet on Friday. States hit his head. Also complaining of back pain radiating down legs bilaterally. Patient reports main concern is that he feels drowsy. Denies LOC.

## 2015-04-05 ENCOUNTER — Emergency Department (HOSPITAL_COMMUNITY): Payer: Self-pay

## 2015-04-05 NOTE — ED Notes (Signed)
Patient stated wanted to leave AMA due to family emergency. Patient verbalized understanding of risks of leaving against medical advice and informed to come back to ED for further evaluation and for worsening symptoms or concerns.

## 2015-04-05 NOTE — ED Provider Notes (Signed)
CSN: [id number]     Arrival date & time 04/04/15  2317 History   First MD Initiated Contact with Patient 04/05/15 0014     Chief Complaint  Patient presents with  . Fall     (Consider location/radiation/quality/duration/timing/severity/associated sxs/prior Treatment) Patient is a 34 y.o. male presenting with fall. The history is provided by the patient. No language interpreter was used.  Fall This is a new problem. The current episode started yesterday. The problem occurs constantly. The problem has been gradually worsening. Associated symptoms include headaches. Pertinent negatives include no abdominal pain, chest pain, chills, fever, nausea or vomiting.   Jeffrey Schultz is a 34 y.o. male who presents to the ED with back pain after falling approximately 12 feet from a ladder yesterday. When he fell he hit his head and back. He thought he was ok but today he has had pain in the lower back and his legs feel like they are asleep. He also complains of feeling sleepy all day. He denies LOC when he fell. He has had headache today and occasional dizziness.  He has not taken any medication for pain.   History reviewed. No pertinent past medical history. History reviewed. No pertinent past surgical history. History reviewed. No pertinent family history. Social History  Substance Use Topics  . Smoking status: Current Every Day Smoker  . Smokeless tobacco: None  . Alcohol Use: No    Review of Systems  Constitutional: Negative for fever and chills.  Eyes: Negative for visual disturbance.  Respiratory: Negative for shortness of breath.   Cardiovascular: Negative for chest pain.  Gastrointestinal: Negative for nausea, vomiting and abdominal pain.  Genitourinary: Negative for dysuria, urgency and frequency.  Musculoskeletal: Positive for back pain.  Skin: Positive for wound.  Neurological: Positive for light-headedness and headaches. Negative for syncope.  Psychiatric/Behavioral: Negative for  confusion. The patient is not nervous/anxious.       Allergies  Review of patient's allergies indicates no known allergies.  Home Medications   Prior to Admission medications   Medication Sig Start Date End Date Taking? Authorizing Provider  acetaminophen-codeine (TYLENOL #3) 300-30 MG per tablet Take 1-2 tablets by mouth every 6 (six) hours as needed. 04/22/14   Ivery Quale, PA-C  amoxicillin (AMOXIL) 500 MG capsule Take 1 capsule (500 mg total) by mouth 3 (three) times daily. 03/05/13   Tammy Triplett, PA-C  clindamycin (CLEOCIN) 150 MG capsule 1 po ac and hs, qid 04/22/14   Ivery Quale, PA-C  cyclopentolate (CYCLOGYL) 1 % ophthalmic solution Apply 1 drop to eye QID. 03/05/13   Tammy Triplett, PA-C  hydrocortisone ointment 0.5 % Apply 1 application topically 2 (two) times daily. 03/14/15   Derwood Kaplan, MD  ibuprofen (ADVIL,MOTRIN) 600 MG tablet Take 1 tablet (600 mg total) by mouth every 6 (six) hours as needed. 03/14/15   Derwood Kaplan, MD  traMADol (ULTRAM) 50 MG tablet Take 1 tablet (50 mg total) by mouth every 6 (six) hours as needed. 03/14/15   Ankit Rhunette Croft, MD   BP 147/83 mmHg  Pulse 106  Temp(Src) 97.8 F (36.6 C) (Oral)  Resp 18  Ht  (1.702 m)  Wt 160 lb (72.576 kg)  BMI 25.05 kg/m2  SpO2 100% Physical Exam  Constitutional: He is oriented to person, place, and time. He appears well-developed and well-nourished. No distress.  HENT:  Head: Normocephalic and atraumatic.  Eyes: EOM are normal. Pupils are equal, round, and reactive to light.  Neck: Trachea normal. Spinous process tenderness and  muscular tenderness present.  Cardiovascular: Normal rate and regular rhythm.   Pulmonary/Chest: Effort normal. No respiratory distress. He has no wheezes. He has no rales.  Abdominal: Soft. Bowel sounds are normal. There is no tenderness.  Musculoskeletal: He exhibits no edema.       Cervical back: He exhibits decreased range of motion (due to pain) and tenderness. He  exhibits normal pulse.       Lumbar back: He exhibits tenderness, pain and spasm. He exhibits normal range of motion, no deformity and normal pulse.  Neurological: He is alert and oriented to person, place, and time. He has normal strength. No cranial nerve deficit or sensory deficit. Coordination and gait normal.  Reflex Scores:      Bicep reflexes are 2+ on the right side and 2+ on the left side.      Brachioradialis reflexes are 2+ on the right side and 2+ on the left side.      Patellar reflexes are 2+ on the right side and 2+ on the left side.      Achilles reflexes are 2+ on the right side and 2+ on the left side. Skin: Skin is warm and dry.  Psychiatric: He has a normal mood and affect. His behavior is normal.  Nursing note and vitals reviewed.   ED Course  Procedures (including critical care time) Labs Review Labs Reviewed - No data to display  Imaging Review No results found. I have personally reviewed and evaluated these images and lab results as part of my medical decision-making.   MDM  34 y.o. male with neck pain, back pain, headache and feeling sleepy all day s/p fall from a 12 foot ladder yesterday. Patient left AMA due to family emergency per RN. States he may return if things work out.   Final diagnoses:  Fall from ladder, initial encounter       [facility], NP 04/05/15 1610  Zadie Rhine, MD 04/05/15 [phone]

## 2015-12-30 ENCOUNTER — Emergency Department (HOSPITAL_COMMUNITY): Payer: Self-pay

## 2015-12-30 ENCOUNTER — Inpatient Hospital Stay (HOSPITAL_COMMUNITY)
Admission: EM | Admit: 2015-12-30 | Discharge: 2016-01-01 | DRG: 917 | Disposition: A | Discharge status: Home / Self Care | Payer: Self-pay | Attending: Internal Medicine | Admitting: Internal Medicine

## 2015-12-30 ENCOUNTER — Encounter (HOSPITAL_COMMUNITY): Payer: Self-pay | Admitting: Emergency Medicine

## 2015-12-30 DIAGNOSIS — F419 Anxiety disorder, unspecified: Secondary | ICD-10-CM | POA: Diagnosis present

## 2015-12-30 DIAGNOSIS — Z781 Physical restraint status: Secondary | ICD-10-CM

## 2015-12-30 DIAGNOSIS — F17210 Nicotine dependence, cigarettes, uncomplicated: Secondary | ICD-10-CM | POA: Diagnosis present

## 2015-12-30 DIAGNOSIS — G92 Toxic encephalopathy: Secondary | ICD-10-CM | POA: Diagnosis present

## 2015-12-30 DIAGNOSIS — T50904A Poisoning by unspecified drugs, medicaments and biological substances, undetermined, initial encounter: Secondary | ICD-10-CM

## 2015-12-30 DIAGNOSIS — F17200 Nicotine dependence, unspecified, uncomplicated: Secondary | ICD-10-CM | POA: Diagnosis present

## 2015-12-30 DIAGNOSIS — J9601 Acute respiratory failure with hypoxia: Secondary | ICD-10-CM | POA: Diagnosis present

## 2015-12-30 DIAGNOSIS — D72829 Elevated white blood cell count, unspecified: Secondary | ICD-10-CM | POA: Diagnosis present

## 2015-12-30 DIAGNOSIS — T50901A Poisoning by unspecified drugs, medicaments and biological substances, accidental (unintentional), initial encounter: Principal | ICD-10-CM | POA: Diagnosis present

## 2015-12-30 DIAGNOSIS — J9690 Respiratory failure, unspecified, unspecified whether with hypoxia or hypercapnia: Secondary | ICD-10-CM | POA: Diagnosis present

## 2015-12-30 DIAGNOSIS — E876 Hypokalemia: Secondary | ICD-10-CM | POA: Diagnosis present

## 2015-12-30 DIAGNOSIS — G9340 Encephalopathy, unspecified: Secondary | ICD-10-CM | POA: Diagnosis present

## 2015-12-30 HISTORY — DX: Anxiety disorder, unspecified: F41.9

## 2015-12-30 HISTORY — DX: Major depressive disorder, single episode, unspecified: F32.9

## 2015-12-30 HISTORY — DX: Depression, unspecified: F32.A

## 2015-12-30 LAB — CBC WITH DIFFERENTIAL/PLATELET
BASOS ABS: 0.1 10*3/uL (ref 0.0–0.1)
BASOS PCT: 0 %
EOS ABS: 0.2 10*3/uL (ref 0.0–0.7)
EOS PCT: 1 %
HCT: 44.4 % (ref 39.0–52.0)
Hemoglobin: 15 g/dL (ref 13.0–17.0)
Lymphocytes Relative: 34 %
Lymphs Abs: 4.5 10*3/uL — ABNORMAL HIGH (ref 0.7–4.0)
MCH: 29.5 pg (ref 26.0–34.0)
MCHC: 33.8 g/dL (ref 30.0–36.0)
MCV: 87.2 fL (ref 78.0–100.0)
MONO ABS: 1 10*3/uL (ref 0.1–1.0)
Monocytes Relative: 8 %
Neutro Abs: 7.7 10*3/uL (ref 1.7–7.7)
Neutrophils Relative %: 57 %
PLATELETS: 329 10*3/uL (ref 150–400)
RBC: 5.09 MIL/uL (ref 4.22–5.81)
RDW: 13.8 % (ref 11.5–15.5)
WBC: 13.4 10*3/uL — ABNORMAL HIGH (ref 4.0–10.5)

## 2015-12-30 LAB — COMPREHENSIVE METABOLIC PANEL
ALT: 59 U/L (ref 17–63)
AST: 33 U/L (ref 15–41)
Albumin: 3.9 g/dL (ref 3.5–5.0)
Alkaline Phosphatase: 52 U/L (ref 38–126)
Anion gap: 9 (ref 5–15)
BUN: 13 mg/dL (ref 6–20)
CHLORIDE: 104 mmol/L (ref 101–111)
CO2: 28 mmol/L (ref 22–32)
Calcium: 9.2 mg/dL (ref 8.9–10.3)
Creatinine, Ser: 0.83 mg/dL (ref 0.61–1.24)
GFR calc Af Amer: >60 mL/min (ref >60)
Glucose, Bld: 108 mg/dL — ABNORMAL HIGH (ref 65–99)
POTASSIUM: 3.4 mmol/L — AB (ref 3.5–5.1)
SODIUM: 141 mmol/L (ref 135–145)
Total Bilirubin: 0.4 mg/dL (ref 0.3–1.2)
Total Protein: 7.4 g/dL (ref 6.5–8.1)

## 2015-12-30 LAB — BLOOD GAS, ARTERIAL
ACID-BASE EXCESS: 4.6 mmol/L — AB (ref 0.0–2.0)
Acid-Base Excess: 2.7 mmol/L — ABNORMAL HIGH (ref 0.0–2.0)
BICARBONATE: 26.6 meq/L — AB (ref 20.0–24.0)
Bicarbonate: 28.8 mEq/L — ABNORMAL HIGH (ref 20.0–24.0)
Drawn by: 317771
Drawn by: 317771
FIO2: 1
LHR: 14 {breaths}/min
O2 Content: 100 L/min
O2 Saturation: 96.8 %
O2 Saturation: 99.8 %
PCO2 ART: 42.3 mmHg (ref 35.0–45.0)
PCO2 ART: 44.2 mmHg (ref 35.0–45.0)
PEEP: 5 cmH2O
PO2 ART: 87.8 mmHg (ref 80.0–100.0)
PO2 ART: 467 mmHg — AB (ref 80.0–100.0)
TCO2: 19.6 mmol/L (ref 0–100)
TCO2: 19.7 mmol/L (ref 0–100)
VT: 550 mL
pH, Arterial: 7.444 (ref 7.350–7.450)
pH, Arterial: 7.404 (ref 7.350–7.450)

## 2015-12-30 LAB — RAPID URINE DRUG SCREEN, HOSP PERFORMED
AMPHETAMINES: NOT DETECTED
AMPHETAMINES: NOT DETECTED
BARBITURATES: NOT DETECTED
BARBITURATES: NOT DETECTED
BENZODIAZEPINES: NOT DETECTED
BENZODIAZEPINES: POSITIVE — AB
Cocaine: NOT DETECTED
Cocaine: NOT DETECTED
Opiates: NOT DETECTED
Opiates: POSITIVE — AB
TETRAHYDROCANNABINOL: NOT DETECTED
TETRAHYDROCANNABINOL: POSITIVE — AB

## 2015-12-30 LAB — URINALYSIS, ROUTINE W REFLEX MICROSCOPIC
Bilirubin Urine: NEGATIVE
GLUCOSE, UA: NEGATIVE mg/dL
Hgb urine dipstick: NEGATIVE
KETONES UR: NEGATIVE mg/dL
LEUKOCYTES UA: NEGATIVE
Nitrite: NEGATIVE
PH: 6 (ref 5.0–8.0)
Protein, ur: NEGATIVE mg/dL

## 2015-12-30 LAB — MRSA PCR SCREENING: MRSA by PCR: POSITIVE — AB

## 2015-12-30 LAB — D-DIMER, QUANTITATIVE (NOT AT ARMC)

## 2015-12-30 LAB — CBG MONITORING, ED: Glucose-Capillary: 118 mg/dL — ABNORMAL HIGH (ref 65–99)

## 2015-12-30 LAB — ACETAMINOPHEN LEVEL

## 2015-12-30 LAB — SALICYLATE LEVEL: Salicylate Lvl: <4 mg/dL (ref 2.8–30.0)

## 2015-12-30 LAB — ETHANOL

## 2015-12-30 MED ORDER — ENOXAPARIN SODIUM 40 MG/0.4ML ~~LOC~~ SOLN
40.0000 mg | SUBCUTANEOUS | Status: DC
  Administered 2015-12-30: 40 mg via SUBCUTANEOUS
  Filled 2015-12-30 (×3): qty 0.4

## 2015-12-30 MED ORDER — PROPOFOL 1000 MG/100ML IV EMUL
5.0000 ug/kg/min | INTRAVENOUS | Status: DC
  Administered 2015-12-30: 20 ug/kg/min via INTRAVENOUS

## 2015-12-30 MED ORDER — SODIUM CHLORIDE 0.9 % IV SOLN
20.0000 ug/h | INTRAVENOUS | Status: DC
  Administered 2015-12-30: 100 ug/h via INTRAVENOUS
  Administered 2015-12-30 – 2015-12-31 (×2): 20 ug/h via INTRAVENOUS
  Filled 2015-12-30 (×2): qty 50

## 2015-12-30 MED ORDER — FENTANYL CITRATE (PF) 100 MCG/2ML IJ SOLN
INTRAMUSCULAR | Status: AC
  Filled 2015-12-30: qty 2

## 2015-12-30 MED ORDER — FENTANYL CITRATE (PF) 100 MCG/2ML IJ SOLN
INTRAMUSCULAR | Status: AC
  Administered 2015-12-30: 100 ug via INTRAVENOUS
  Filled 2015-12-30: qty 2

## 2015-12-30 MED ORDER — ALBUTEROL SULFATE (2.5 MG/3ML) 0.083% IN NEBU
2.5000 mg | INHALATION_SOLUTION | RESPIRATORY_TRACT | Status: DC
  Administered 2015-12-30 – 2015-12-31 (×6): 2.5 mg via RESPIRATORY_TRACT
  Filled 2015-12-30 (×7): qty 3

## 2015-12-30 MED ORDER — SODIUM CHLORIDE 0.9 % IV SOLN: 250.0000 mL | INTRAVENOUS | Status: DC | PRN

## 2015-12-30 MED ORDER — POTASSIUM CHLORIDE IN NACL 20-0.9 MEQ/L-% IV SOLN
INTRAVENOUS | Status: DC
  Administered 2015-12-30 – 2016-01-01 (×6): via INTRAVENOUS

## 2015-12-30 MED ORDER — MUPIROCIN 2 % EX OINT
1.0000 "application " | TOPICAL_OINTMENT | Freq: Two times a day (BID) | CUTANEOUS | Status: DC
  Administered 2015-12-30 – 2016-01-01 (×4): 1 via NASAL
  Filled 2015-12-30 (×2): qty 22

## 2015-12-30 MED ORDER — CHLORHEXIDINE GLUCONATE 0.12% ORAL RINSE (MEDLINE KIT)
15.0000 mL | Freq: Two times a day (BID) | OROMUCOSAL | Status: DC
  Administered 2015-12-30 – 2015-12-31 (×3): 15 mL via OROMUCOSAL

## 2015-12-30 MED ORDER — ONDANSETRON HCL 4 MG/2ML IJ SOLN: 4.0000 mg | Freq: Four times a day (QID) | INTRAMUSCULAR | Status: DC | PRN

## 2015-12-30 MED ORDER — SODIUM CHLORIDE 0.9 % IV SOLN
2.0000 mg/h | INTRAVENOUS | Status: DC
  Administered 2015-12-30: 4 mg/h via INTRAVENOUS
  Administered 2015-12-30 – 2015-12-31 (×2): 2 mg/h via INTRAVENOUS
  Filled 2015-12-30 (×3): qty 10

## 2015-12-30 MED ORDER — MIDAZOLAM HCL 2 MG/2ML IJ SOLN
INTRAMUSCULAR | Status: AC
  Filled 2015-12-30: qty 2

## 2015-12-30 MED ORDER — FENTANYL CITRATE (PF) 100 MCG/2ML IJ SOLN
INTRAMUSCULAR | Status: AC
  Filled 2015-12-30: qty 4

## 2015-12-30 MED ORDER — PANTOPRAZOLE SODIUM 40 MG IV SOLR
40.0000 mg | Freq: Every day | INTRAVENOUS | Status: DC
  Administered 2015-12-30 – 2015-12-31 (×2): 40 mg via INTRAVENOUS
  Filled 2015-12-30 (×2): qty 40

## 2015-12-30 MED ORDER — FENTANYL CITRATE (PF) 100 MCG/2ML IJ SOLN
100.0000 ug | Freq: Once | INTRAMUSCULAR | Status: AC
  Administered 2015-12-30: 100 ug via INTRAVENOUS

## 2015-12-30 MED ORDER — MIDAZOLAM HCL 2 MG/2ML IJ SOLN
2.0000 mg | Freq: Once | INTRAMUSCULAR | Status: AC
  Administered 2015-12-30: 2 mg via INTRAVENOUS

## 2015-12-30 MED ORDER — SODIUM CHLORIDE 0.9 % IV BOLUS (SEPSIS)
1000.0000 mL | Freq: Once | INTRAVENOUS | Status: AC
  Administered 2015-12-30: 1000 mL via INTRAVENOUS

## 2015-12-30 MED ORDER — ANTISEPTIC ORAL RINSE SOLUTION (CORINZ)
7.0000 mL | Freq: Four times a day (QID) | OROMUCOSAL | Status: DC
  Administered 2015-12-30 – 2015-12-31 (×4): 7 mL via OROMUCOSAL

## 2015-12-30 MED ORDER — ALBUTEROL SULFATE (2.5 MG/3ML) 0.083% IN NEBU: 2.5000 mg | INHALATION_SOLUTION | RESPIRATORY_TRACT | Status: DC | PRN

## 2015-12-30 MED ORDER — SODIUM CHLORIDE 0.9 % IV SOLN
INTRAVENOUS | Status: AC
  Administered 2015-12-30: 07:00:00 via INTRAVENOUS

## 2015-12-30 MED ORDER — FENTANYL CITRATE (PF) 2500 MCG/50ML IJ SOLN
INTRAMUSCULAR | Status: AC
  Filled 2015-12-30: qty 50

## 2015-12-30 MED ORDER — ACETAMINOPHEN 325 MG PO TABS: 650.0000 mg | ORAL_TABLET | ORAL | Status: DC | PRN

## 2015-12-30 MED ORDER — CHLORHEXIDINE GLUCONATE CLOTH 2 % EX PADS
6.0000 | MEDICATED_PAD | Freq: Every day | CUTANEOUS | Status: DC
  Administered 2015-12-31 – 2016-01-01 (×2): 6 via TOPICAL

## 2015-12-30 MED ORDER — NALOXONE HCL 2 MG/2ML IJ SOSY
PREFILLED_SYRINGE | INTRAMUSCULAR | Status: AC
  Filled 2015-12-30: qty 2

## 2015-12-30 MED ORDER — PNEUMOCOCCAL VAC POLYVALENT 25 MCG/0.5ML IJ INJ
0.5000 mL | INJECTION | INTRAMUSCULAR | Status: DC
  Filled 2015-12-30: qty 0.5

## 2015-12-30 MED ORDER — MIDAZOLAM HCL 50 MG/10ML IJ SOLN
INTRAMUSCULAR | Status: AC
  Filled 2015-12-30: qty 1

## 2015-12-30 NOTE — ED Provider Notes (Signed)
CSN: [id number]     Arrival date & time 12/30/15  0107 History   First MD Initiated Contact with Patient 12/30/15 [phone]     Chief Complaint  Patient presents with  . Anxiety     (Consider location/radiation/quality/duration/timing/severity/associated sxs/prior Treatment) HPI Comments: Level V caveat for altered mental status. Patient brought in by girlfriend stating it is "difficult for him to breathe". She states this is going on for several days. She gave him one of her Xanax since last night and attempt to make him breathe better. He denies any regular prescription or illicit drug abuse. Denies any significant alcohol abuse. He is rather somnolent but arouses and answers questions appropriately. He has slurred speech which the girlfriend states comes and goes. He is alert and oriented 3. He answers questions appropriately. He denies chest pain, cough, fever, abdominal pain, nausea or vomiting.  There is a history of smoker  no history of asthma or COPD.  The history is provided by the patient and a relative. The history is limited by the condition of the patient.    History reviewed. No pertinent past medical history. History reviewed. No pertinent past surgical history. History reviewed. No pertinent family history. Social History  Substance Use Topics  . Smoking status: Current Every Day Smoker  . Smokeless tobacco: None  . Alcohol Use: No    Review of Systems  Unable to perform ROS: Mental status change      Allergies  Review of patient's allergies indicates no known allergies.  Home Medications   Prior to Admission medications   Medication Sig Start Date End Date Taking? Authorizing Provider  acetaminophen-codeine (TYLENOL #3) 300-30 MG per tablet Take 1-2 tablets by mouth every 6 (six) hours as needed. 04/22/14   Ivery QualeHobson Bryant, PA-C  amoxicillin (AMOXIL) 500 MG capsule Take 1 capsule (500 mg total) by mouth 3 (three) times daily. 03/05/13   Tammy Triplett, PA-C   clindamycin (CLEOCIN) 150 MG capsule 1 po ac and hs, qid 04/22/14   Ivery QualeHobson Bryant, PA-C  cyclopentolate (CYCLOGYL) 1 % ophthalmic solution Apply 1 drop to eye QID. 03/05/13   Tammy Triplett, PA-C  hydrocortisone ointment 0.5 % Apply 1 application topically 2 (two) times daily. 03/14/15   Derwood KaplanAnkit Nanavati, MD  ibuprofen (ADVIL,MOTRIN) 600 MG tablet Take 1 tablet (600 mg total) by mouth every 6 (six) hours as needed. 03/14/15   Derwood KaplanAnkit Nanavati, MD  traMADol (ULTRAM) 50 MG tablet Take 1 tablet (50 mg total) by mouth every 6 (six) hours as needed. 03/14/15   Ankit Rhunette CroftNanavati, MD   BP 111/70 mmHg  Pulse 68  Temp(Src) 98.9 F (37.2 C)  Resp 24  Ht 5\' 9"  (1.753 m)  Wt 160 lb (72.576 kg)  BMI 23.62 kg/m2  SpO2 97% Physical Exam  Constitutional: He is oriented to person, place, and time. He appears well-developed and well-nourished. No distress.  Somnolent, slurred speech, arouses to answer questions appropriately.  Patient with a very strange breathing pattern. He sucks in his cheeks and his left nare when he breathes the girlfriend states he has had in the past.  HENT:  Head: Normocephalic and atraumatic.  Mouth/Throat: Oropharynx is clear and moist. No oropharyngeal exudate.  Eyes: Conjunctivae and EOM are normal. Pupils are equal, round, and reactive to light.  Neck: Normal range of motion. Neck supple.  No meningismus.  Cardiovascular: Normal rate, regular rhythm, normal heart sounds and intact distal pulses.   No murmur heard. Pulmonary/Chest: Effort normal and breath sounds normal. No  respiratory distress. He exhibits no tenderness.  Abdominal: Soft. There is no tenderness. There is no rebound and no guarding.  Musculoskeletal: Normal range of motion. He exhibits no edema or tenderness.  Neurological: He is alert and oriented to person, place, and time. No cranial nerve deficit. He exhibits normal muscle tone. Coordination normal.  No ataxia on finger to nose bilaterally. No pronator drift. 5/5  strength throughout. CN 2-12 intact.Equal grip strength. Sensation intact.   Skin: Skin is warm.  Psychiatric: He has a normal mood and affect. His behavior is normal.  Nursing note and vitals reviewed.   ED Course  .Intubation Date/Time: 12/30/2015 5:00 AM Performed by: Glynn Octave Authorized by: Glynn Octave Consent: The procedure was performed in an emergent situation. Time out: Immediately prior to procedure a "time out" was called to verify the correct patient, procedure, equipment, support staff and site/side marked as required. Indications: respiratory failure,  airway protection and  hypoxemia Intubation method: video-assisted Patient status: paralyzed (RSI) Sedatives: etomidate Paralytic: succinylcholine Laryngoscope size: Mac 4 Tube size: 7.5 mm Tube type: cuffed Number of attempts: 1 Ventilation between attempts: BVM Cricoid pressure: no Cords visualized: yes Post-procedure assessment: chest rise and ETCO2 monitor Breath sounds: equal Cuff inflated: yes ETT to lip: 24 cm Tube secured with: ETT holder Chest x-ray findings: endotracheal tube in appropriate position Patient tolerance: Patient tolerated the procedure well with no immediate complications   (including critical care time) Labs Review Labs Reviewed  URINALYSIS, ROUTINE W REFLEX MICROSCOPIC (NOT AT [facility]) - Abnormal; Notable for the following:    Color, Urine STRAW (*)    Specific Gravity, Urine <1.005 (*)    All other components within normal limits  CBC WITH DIFFERENTIAL/PLATELET - Abnormal; Notable for the following:    WBC 13.4 (*)    Lymphs Abs 4.5 (*)    All other components within normal limits  COMPREHENSIVE METABOLIC PANEL - Abnormal; Notable for the following:    Potassium 3.4 (*)    Glucose, Bld 108 (*)    All other components within normal limits  ACETAMINOPHEN LEVEL - Abnormal; Notable for the following:    Acetaminophen (Tylenol), Serum <10 (*)    All other components within  normal limits  BLOOD GAS, ARTERIAL - Abnormal; Notable for the following:    Bicarbonate 28.8 (*)    Acid-Base Excess 4.6 (*)    All other components within normal limits  BLOOD GAS, ARTERIAL - Abnormal; Notable for the following:    pO2, Arterial 467 (*)    Bicarbonate 26.6 (*)    Acid-Base Excess 2.7 (*)    All other components within normal limits  URINE RAPID DRUG SCREEN, HOSP PERFORMED - Abnormal; Notable for the following:    Opiates POSITIVE (*)    Benzodiazepines POSITIVE (*)    Tetrahydrocannabinol POSITIVE (*)    All other components within normal limits  CBG MONITORING, ED - Abnormal; Notable for the following:    Glucose-Capillary 118 (*)    All other components within normal limits  URINE RAPID DRUG SCREEN, HOSP PERFORMED  ETHANOL  SALICYLATE LEVEL  D-DIMER, QUANTITATIVE (NOT AT [facility])    Imaging Review No results found. I have personally reviewed and evaluated these images and lab results as part of my medical decision-making.   EKG Interpretation   Date/Time:  Wednesday Dec 30 2015 05:04:45 EDT Ventricular Rate:  119 PR Interval:  137 QRS Duration: 97 QT Interval:  340 QTC Calculation: 478 R Axis:   102 Text Interpretation:  Sinus tachycardia Right axis deviation ST elev,  probable normal early repol pattern Borderline prolonged QT interval No  previous ECGs available Confirmed by Manus Gunning  MD, Emmalynne Courtney [phone]) on  12/30/2015 7:27:39 AM      MDM   Final diagnoses:  Acute respiratory failure with hypoxia (HCC)  Drug overdose, undetermined intent, initial encounter  Patient complains "I cannot breathe". He is somnolent, follows commands, he has a strange respiratory pattern as above. Good air exchange without wheezing. 96% on room air. Denies drug use. CXR, labs, ABG.  CXR negative. No CO2 retention on ABG. D-dimer negative. APAP, ASA, ETOH negative. Normal anion gap. Drug screen negative.   Acute change in mental status at 5am. Found to be  cyanotic, mottled, unresponsive.  O2 saturation 15%.  Had been seen walking to bathroom by myself approximately 20 minutes prior. NRB placed. No response to 2 mg narcan. Intubated.  Suspect patient took some kind of drugs when he was in the bathroom. As he was seen going to the bathroom just prior to him being found cyanotic and apneic. No meds given in ED. No response to Narcan.  Repeat drug screen shows benzodiazepines, opiates and THC. this is before he received fentanyl and Versed from medical staff. Girl friend reports that initial drug screen was given by someone else in the waiting room.  Repeat CT head is stable. No evidence of foreign body in neck. Stable and sedated on ventilator. Repeat CXR and ABG obtained.   Admission d/w Dr. Conley Rolls.  D/w Dr. Juanetta Gosling who will consult.  CRITICAL CARE Performed by: Glynn Octave Total critical care time: 60 minutes Critical care time was exclusive of separately billable procedures and treating other patients. Critical care was necessary to treat or prevent imminent or life-threatening deterioration. Critical care was time spent personally by me on the following activities: development of treatment plan with patient and/or surrogate as well as nursing, discussions with consultants, evaluation of patient's response to treatment, examination of patient, obtaining history from patient or surrogate, ordering and performing treatments and interventions, ordering and review of laboratory studies, ordering and review of radiographic studies, pulse oximetry and re-evaluation of patient's condition.     Glynn Octave, MD 12/30/15 1005

## 2015-12-30 NOTE — ED Notes (Addendum)
Per AM report, pt intially came in complaining of anxiety. Urine sample received prior to bringing pt back to room. Initial urinalysis came back negative. Pt collapsed after going to bathroom. Pt rushed into room,intubated and temp foley inserted. Second urinalysis sent off. Pt tested positive for benzodiazepines and THC. Pt girlfriend reported, " i didn't pee for him someone in the waiting room did." At time of coming on shift pt not sedated and trying to pull out foley and ET tube. Pt intermittently suctioned and consoled with no avail. EDP aware and gave verbal order for soft restraints. Mittens also placed at this time.Skin intact, Moderate distal pulses noted in all extremities.   Family aware of pt situation and that due to patient and staff safety, family is not allowed at bedside at this time.

## 2015-12-30 NOTE — ED Notes (Signed)
Pt's cell phone is in girlfriend's possession. Pt's wallet, partial plate, and clothing placed in pt belonging bag in room.

## 2015-12-30 NOTE — ED Notes (Addendum)
Pt still attempting to sit up in bed and pull out tubes. EDP aware and given verbal order for of fentanyl and 4 mg of versed IV push. Upon returning to the room pt systolic bp ranging from 93-97. NS bolus increased and medication mentioned above were not given.

## 2015-12-30 NOTE — ED Notes (Signed)
Pt is hyperventilating in triage. I informed the pt to slow his breathing down. Pt is very sleep in triage and was stumbling in the waiting room.

## 2015-12-30 NOTE — ED Notes (Signed)
Pt states he feels like it is hard for him to breath. O2 sats 97%. Pt also says unable to eat d/t vomiting. Pt noted to be drowsy in treatment room.

## 2015-12-30 NOTE — ED Notes (Signed)
Verbal order given to administer 200mcg of fentanyl and 4mg  of Versed IV push. Due to pt bp, these medications were pulled from ER pyxis but were not administered. Attempted to return medication to pyxis. Unable to return, consulted pharmacy and reported would have to waste medication and to get another RN to witness waste. Consulting civil engineerCharge RN and ER Director aware.   All three vials wasted and disposed of in sharps container. Witnessed by Glenda Chromanobin Knowles,RN.

## 2015-12-30 NOTE — ED Notes (Signed)
RT paged to assist with patient transport to ICU.

## 2015-12-30 NOTE — H&P (Signed)
History and Physical    Jeffrey Schultz UEA:[id number] DOB: [birth year] DOA: 12/30/2015  Referring MD/NP/PA:Stephen Rancour, MD PCP: No PCP Per Patient  Outpatient Specialists: None Patient coming from: Home   Chief Complaint: Anxiety/AMS  HPI: Jeffrey Schultz is a 35 y.o. male presented with reports of shortness of breath and anxiety. Patient currently intubated and unable to provide history. Per mother he has recently moved back into parents' home but their history of acute respiratory distress and AMS is also limited. The past few days he has been increasingly anxious, restless, auditory hallucinations, vomiting, and SOB with wheezes. She denies any cough and fever. He has recently been increasingly stressed and depressed after his four children were removed from his care by social services. Mother reports that he had gone out last night and did not return. He was brought to the ED for AMS. Mother reports one episodes of seizure years ago without any recurrence. She also but denies any chronic medical or mental issues. He does not have a PCP.   ED Course: On arrival he was noted to be lethargic but able to ambulate to the bathroom.  Per EDP after patient returned form the bathroom he was noted to become more somnolent, cyanotic, and mottled. He began to destat and required intubation. It was suspected he took some type of drug in the restroom. No response to narcan. His girlfriend admitted that initial urine submitted did not belong to the patient. He hs been referred for admission for suspected overdose.  Repeat UDS revealed benzodiazepines, opiates and THC. Initial UDS negative, CXR negative, No CO2 retention on ABG. D-dimer negative.  Review of Systems: Unable to obtain due to patient being intubated and sedated.  History reviewed. No pertinent past medical history.  History reviewed. No pertinent past surgical history.   reports that he has been smoking.  He does not have any smokeless  tobacco history on file. He reports that he does not drink alcohol or use illicit drugs.  No Known Allergies  History reviewed. No pertinent family history.   Prior to Admission medications   Medication Sig Start Date End Date Taking? Authorizing Provider  acetaminophen-codeine (TYLENOL #3) 300-30 MG per tablet Take 1-2 tablets by mouth every 6 (six) hours as needed. 04/22/14   Ivery QualeHobson Bryant, PA-C  amoxicillin (AMOXIL) 500 MG capsule Take 1 capsule (500 mg total) by mouth 3 (three) times daily. 03/05/13   Tammy Triplett, PA-C  clindamycin (CLEOCIN) 150 MG capsule 1 po ac and hs, qid 04/22/14   Ivery QualeHobson Bryant, PA-C  cyclopentolate (CYCLOGYL) 1 % ophthalmic solution Apply 1 drop to eye QID. 03/05/13   Tammy Triplett, PA-C  hydrocortisone ointment 0.5 % Apply 1 application topically 2 (two) times daily. 03/14/15   Derwood KaplanAnkit Nanavati, MD  ibuprofen (ADVIL,MOTRIN) 600 MG tablet Take 1 tablet (600 mg total) by mouth every 6 (six) hours as needed. 03/14/15   Derwood KaplanAnkit Nanavati, MD  traMADol (ULTRAM) 50 MG tablet Take 1 tablet (50 mg total) by mouth every 6 (six) hours as needed. 03/14/15   Derwood KaplanAnkit Nanavati, MD    Physical Exam: Filed Vitals:   12/30/15 0715 12/30/15 0730 12/30/15 0745 12/30/15 0800  BP: 101/73 93/63 93/62  96/62  Pulse: 99 90 74 79  Temp: 97 F (36.1 C) 97.7 F (36.5 C) 97.9 F (36.6 C) 97.9 F (36.6 C)  TempSrc:      Resp: 23 16 24 14   Height:      Weight:      SpO2:  100% 100% 100% 100%       Filed Vitals:   12/30/15 0715 12/30/15 0730 12/30/15 0745 12/30/15 0800  BP: 101/73 93/63 93/62  96/62  Pulse: 99 90 74 79  Temp: 97 F (36.1 C) 97.7 F (36.5 C) 97.9 F (36.6 C) 97.9 F (36.6 C)  TempSrc:      Resp: Height:      Weight:      SpO2: 100% 100% 100% 100%   Constitutional: NAD. Intubated and sedated.  Respiratory: clear to auscultation bilaterally, no wheezing, no crackles. Normal respiratory effort. No accessory muscle use.  Cardiovascular: Regular rate and  rhythm, no murmurs / rubs / gallops. No extremity edema. 2+ pedal pulses. No carotid bruits.  Abdomen: no tenderness, no masses palpated. No hepatosplenomegaly. Bowel sounds positive.  Musculoskeletal: no clubbing / cyanosis. No joint deformity upper and lower extremities. Good ROM, no contractures. Normal muscle tone.  Skin: no rashes, lesions, ulcers. No induration Neurologic: Limited exam due to sedation. No facial asymmetry.      Psychiatric: Sedated     Labs on Admission: I have personally reviewed following labs and imaging studies  CBC:  Recent Labs Lab 12/30/15 0344  WBC 13.4*  NEUTROABS 7.7  HGB 15.0  HCT 44.4  MCV 87.2  PLT 329   Basic Metabolic Panel:  Recent Labs Lab 12/30/15 0344  NA 141  K 3.4*  CL 104  CO2 28  GLUCOSE 108*  BUN 13  CREATININE 0.83  CALCIUM 9.2   Liver Function Tests:  Recent Labs Lab 12/30/15 0344  AST 33  ALT 59  ALKPHOS 52  BILITOT 0.4  PROT 7.4  ALBUMIN 3.9   CBG:  Recent Labs Lab 12/30/15 0412  GLUCAP 118*   Urine analysis:    Component Value Date/Time   COLORURINE STRAW* 12/30/2015 0413   APPEARANCEUR CLEAR 12/30/2015 0413   LABSPEC <1.005* 12/30/2015 0413   PHURINE 6.0 12/30/2015 0413   GLUCOSEU NEGATIVE 12/30/2015 0413   HGBUR NEGATIVE 12/30/2015 0413   BILIRUBINUR NEGATIVE 12/30/2015 0413   KETONESUR NEGATIVE 12/30/2015 0413   PROTEINUR NEGATIVE 12/30/2015 0413   NITRITE NEGATIVE 12/30/2015 0413   LEUKOCYTESUR NEGATIVE 12/30/2015 0413   Sepsis Labs: (procalcitonin:4,lacticidven:4) )No results found for this or any previous visit (from the past 240 hour(s)).   Radiological Exams on Admission: Dg Chest 2 View  12/30/2015  CLINICAL DATA:  Weakness, shortness of breath and unsteady gait for 3 days. EXAM: CHEST  2 VIEW COMPARISON:  None. FINDINGS: The cardiomediastinal contours are normal. The lungs are clear. Pulmonary vasculature is normal. No consolidation, pleural effusion, or pneumothorax. No  acute osseous abnormalities are seen. IMPRESSION: No acute pulmonary process. Electronically Signed   By: Rubye Oaks M.D.   On: 12/30/2015 04:14   Ct Head Wo Contrast  12/30/2015  CLINICAL DATA:  Initial evaluation for acute unresponsiveness. EXAM: CT HEAD WITHOUT CONTRAST TECHNIQUE: Contiguous axial images were obtained from the base of the skull through the vertex without intravenous contrast. COMPARISON:  Prior CT performed earlier the same evening. FINDINGS: There is no acute intracranial hemorrhage or infarct. No mass lesion or midline shift. Gray-white matter differentiation is well maintained. Ventricles are normal in size without evidence of hydrocephalus. CSF containing spaces are within normal limits. No extra-axial fluid collection. The calvarium is intact. Orbital soft tissues are within normal limits. Patient is intubated. Paranasal sinuses are largely clear. No mastoid effusion. Scalp soft tissues are unremarkable. IMPRESSION: Negative head CT.  No  acute intracranial process identified. Electronically Signed   By: Rise Mu M.D.   On: 12/30/2015 07:32   Ct Head Wo Contrast  12/30/2015  CLINICAL DATA:  Altered mental status.  Weakness.  Drowsy. EXAM: CT HEAD WITHOUT CONTRAST TECHNIQUE: Contiguous axial images were obtained from the base of the skull through the vertex without intravenous contrast. COMPARISON:  05/19/2012 FINDINGS: No intracranial hemorrhage, mass effect, or midline shift. No hydrocephalus. The basilar cisterns are patent. No evidence of territorial infarct. No intracranial fluid collection. Calvarium is intact. Included paranasal sinuses and mastoid air cells are well aerated. IMPRESSION: No acute intracranial abnormality. Electronically Signed   By: Rubye Oaks M.D.   On: 12/30/2015 04:18   Ct Soft Tissue Neck Wo Contrast  12/30/2015  CLINICAL DATA:  Initial evaluation for acute unresponsiveness, dyspnea and. Question foreign body. EXAM: CT NECK WITHOUT  CONTRAST TECHNIQUE: Multidetector CT imaging of the neck was performed following the standard protocol without intravenous contrast. COMPARISON:  None. FINDINGS: Partially visualized brain is within normal limits. Mild mucosal thickening within the inferior maxillary sinuses bilaterally. Paranasal sinuses are otherwise clear. Mastoids are well pneumatized.  Middle ear cavities are clear. Salivary glands including the parotid glands and submandibular glands are normal. Patient is intubated with endotracheal and enteric tubes in place. Oral cavity grossly unremarkable without acute abnormality. No acute abnormality about the dentition. Poor dentition noted. Palatine tonsils grossly unremarkable. Parapharyngeal fat preserved. Nasopharynx within normal limits. Small amount of layering secretions within the oropharynx posteriorly, likely related to intubation. Evaluation of the hypopharynx and supraglottic larynx inferiorly is fairly limited due to extensive motion artifact. No definite radiopaque foreign body. Subglottic airway is clear. Carina is not visualize. Thyroid normal. No definite adenopathy on this noncontrast and motion degraded study. Visualized lungs are clear Osseous structures grossly unremarkable, although evaluation somewhat limited by motion. No definite acute abnormality. No worrisome lytic or blastic osseous lesions. IMPRESSION: Limited study due to motion with no definite acute abnormality identified within the neck. Small amount of layering secretions within the oropharynx, likely related to intubation. No definite radiopaque foreign body, although again, evaluation limited due to extensive motion artifact on this exam. Electronically Signed   By: Rise Mu M.D.   On: 12/30/2015 06:33   Dg Chest Portable 1 View  12/30/2015  CLINICAL DATA:  Endotracheal and orogastric tube placement. Mental status change. EXAM: PORTABLE CHEST 1 VIEW COMPARISON:  Chest radiograph earlier this day.  FINDINGS: Endotracheal tube 3.9 cm from the carina. Enteric tube in the distal esophagus. Recommend advancement of 10 cm. The cardiomediastinal contours are normal. The lungs are clear. Pulmonary vasculature is normal. No consolidation, pleural effusion, or pneumothorax. No acute osseous abnormalities are seen. IMPRESSION: 1. Endotracheal tube 3.9 cm from the carina. 2. Enteric tube in the distal esophagus, recommend advancement of 10 cm. 3. Clear lungs. Electronically Signed   By: Rubye Oaks M.D.   On: 12/30/2015 05:30    EKG: Independently reviewed.ST  Assessment/Plan Active Problems:   Respiratory failure (HCC)  1. Acute respiratory failure secondary to drug overdose. Intubated on admission. UDS positive for benzodiazepines, opiates and THC. CXR showed clear lungs and ABG unremarkable. Anticipate he will be able to extubate soon.  2. Possible depression. Family has reported that they feel he has become increasingly depressed over the last month. 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 Family unaware of any SI, but he will likely need  a psychiatry consult after extubation.  3. Leukocytosis. Likely reactive.   4. Tobacco use. Will counsel on cessation once he is extubated.   DVT prophylaxis: Lovenox  Code Status: Full Family Communication: Mother, father, and brother bedside Disposition Plan: Anticipiate discharge in 1-2 days.  Consults called: Pulmonology  Admission status: Inpatient, ICU.   Erick Blinks, MD  [facility] Pager [phone]  If 7PM-7AM, please contact night-coverage [email or web address] Password [email or web address]  12/30/2015, 8:49 AM    By signing my name below, I, Zadie Cleverly, attest that this documentation has been prepared under the direction and in the presence of Erick Blinks, MD. Electronically signed: Zadie Cleverly, Scribe. 12/30/2015 10:16am   I, Dr. Erick Blinks, personally  performed the services described in this documentaiton. All medical record entries made by the scribe were at my direction and in my presence. I have reviewed the chart and agree that the record reflects my personal performance and is accurate and complete  Erick Blinks, MD, 12/30/2015 10:55 AM

## 2015-12-30 NOTE — ED Notes (Signed)
Pt transported to ICU. Pt girlfriend in waiting room. Pt girlfriend informed that patient was taken to ICU and is not allowed any visitors at this time due to current condition. Pt girlfriend verbalized understanding and walked out of ED.

## 2015-12-31 LAB — CBC
HCT: 39.7 % (ref 39.0–52.0)
Hemoglobin: 12.5 g/dL — ABNORMAL LOW (ref 13.0–17.0)
MCH: 28.9 pg (ref 26.0–34.0)
MCHC: 31.5 g/dL (ref 30.0–36.0)
MCV: 91.7 fL (ref 78.0–100.0)
Platelets: 269 10*3/uL (ref 150–400)
RBC: 4.33 MIL/uL (ref 4.22–5.81)
RDW: 14.6 % (ref 11.5–15.5)
WBC: 11.2 10*3/uL — ABNORMAL HIGH (ref 4.0–10.5)

## 2015-12-31 LAB — BLOOD GAS, ARTERIAL
ACID-BASE EXCESS: 1.5 mmol/L (ref 0.0–2.0)
BICARBONATE: 25.2 meq/L — AB (ref 20.0–24.0)
Drawn by: 213101
FIO2: 40
LHR: 12 {breaths}/min
O2 SAT: 97.9 %
PATIENT TEMPERATURE: 37
PCO2 ART: 48.2 mmHg — AB (ref 35.0–45.0)
PEEP/CPAP: 5 cmH2O
PH ART: 7.358 (ref 7.350–7.450)
TCO2: 17.4 mmol/L (ref 0–100)
VT: 600 mL
pO2, Arterial: 111 mmHg — ABNORMAL HIGH (ref 80.0–100.0)

## 2015-12-31 LAB — BASIC METABOLIC PANEL
Anion gap: 3 — ABNORMAL LOW (ref 5–15)
BUN: 12 mg/dL (ref 6–20)
CO2: 29 mmol/L (ref 22–32)
Calcium: 7.9 mg/dL — ABNORMAL LOW (ref 8.9–10.3)
Chloride: 111 mmol/L (ref 101–111)
Creatinine, Ser: 0.82 mg/dL (ref 0.61–1.24)
GFR calc Af Amer: >60 mL/min (ref >60)
GFR calc non Af Amer: >60 mL/min (ref >60)
Glucose, Bld: 88 mg/dL (ref 65–99)
Potassium: 3.9 mmol/L (ref 3.5–5.1)
Sodium: 143 mmol/L (ref 135–145)

## 2015-12-31 NOTE — BH Assessment (Addendum)
Assessment Note  Jeffrey Schultz is an 35 y.o. male that presented to [facility] on 12/30/15 per notes, reporting shortness of breath and increased anxiety. Patient was escorted by friends on admission with patient becoming disoriented while in [facility] waiting area. Patient was  a very poor historian this date and states he does not remember anything from 12/30/15. Patient denies any S/I or H/I and stated he does not remember coming to the hospital. This clinician referred to admission notes that stated: "Per AM report, pt intially came in complaining of anxiety. Urine sample received prior to bringing pt back to room. Initial urinalysis came back negative. Pt collapsed after going to bathroom. Pt rushed into room, intubated and temp foley inserted. Second urinalysis sent off. Pt tested positive for benzodiazepines and THC. Pt girlfriend reported, " I didn't pee for him someone in the waiting room did." Collateral gathered from staff nurse Schonewith RN stated patient allegedly used a unknown substance (believed to be Heroin) while in [facility] bathroom. Patient denies any use but admits to using opiates but has never injected any IV substances. Patient also admits to occasional Cannabis and Xanax use. Patient denies any prior attempts of S/I or inpatient treatment associated with any MH issues. Patient's mother was present at the time of the assessment Jeffrey Schultz) who stated patient currently resides at her residence and reported that patient per her knowledge, has never attempted to harm himself. Patient denies any current legal, AVH or history of outpatient treatment for SA use. Patient states he feels he doesn't need any inpatient/outpatient treatment and is asking to be released this date. Mother who was present stated patient can continue to reside at her residence on discharge and she will monitor him for withdrawals or any threatening behaviors. Case was staffed with [facility] NP who recommenced patient be monitored  overnight for withdrawals or any other MH symptoms. Patient does admit to some ongoing depression and stressors to include relationship issues and lack of employment. Disposition was rendered to [facility] Jeffrey Ege MD who agreed patient required further observation. Patient will be re-evaluated in the a.m.   Diagnosis: Major Depressive D/O, single episode moderate, Polysubstance abuse  Past Medical History:  Past Medical History  Diagnosis Date  . Depression   . Anxiety     History reviewed. No pertinent past surgical history.  Family History: History reviewed. No pertinent family history.  Social History:  reports that he has been smoking.  He does not have any smokeless tobacco history on file. He reports that he uses illicit drugs (Heroin, Benzodiazepines, and Marijuana). He reports that he does not drink alcohol.  Additional Social History:  Alcohol / Drug Use Pain Medications: See MAR Prescriptions: See MAR Over the Counter: See MAR History of alcohol / drug use?: Yes Longest period of sobriety (when/how long): 6 months Substance #1 Name of Substance 1: Opiates 1 - Age of First Use: 20 1 - Amount (size/oz): Hydrocodone  x 2 1 - Frequency: Once a week  1 - Duration: Last year 1 - Last Use / Amount: 12/27/15 5 mg Substance #2 Name of Substance 2: Xanax 2 - Age of First Use: 24 2 - Amount (size/oz): 2 or 3 1 mg 2 - Frequency: daily 2 - Duration: Last year 2 - Last Use / Amount: 12/30/15 Substance #3 Name of Substance 3: Cannabis 3 - Age of First Use: 20 3 - Amount (size/oz): 2 gram 3 - Frequency: 2 or 3 times a month 3 - Duration: Last  year 3 - Last Use / Amount: Unknown  CIWA: CIWA-Ar BP: (!) 82/63 mmHg Pulse Rate: 88 COWS:    Allergies: No Known Allergies  Home Medications:  Medications Prior to Admission  Medication Sig Dispense Refill  . ibuprofen (ADVIL,MOTRIN) 200 MG tablet Take 600 mg by mouth every 6 (six) hours as needed for moderate pain.    .  [DISCONTINUED] acetaminophen-codeine (TYLENOL #3) 300-30 MG per tablet Take 1-2 tablets by mouth every 6 (six) hours as needed. 20 tablet 0  . [DISCONTINUED] amoxicillin (AMOXIL) 500 MG capsule Take 1 capsule (500 mg total) by mouth 3 (three) times daily. 30 capsule 0  . [DISCONTINUED] clindamycin (CLEOCIN) 150 MG capsule 1 po ac and hs, qid 28 capsule 0    OB/GYN Status:  No LMP for male patient.  General Assessment Data Location of Assessment: [facility] TTS Assessment: In system Is this a Tele or Face-to-Face Assessment?: Tele Assessment Is this an Initial Assessment or a Re-assessment for this encounter?: Initial Assessment Marital status: Single Maiden name: na Is patient pregnant?: No Pregnancy Status: No Living Arrangements: Parent Can pt return to current living arrangement?: Yes Admission Status: Voluntary Is patient capable of signing voluntary admission?: Yes Referral Source: Self/Family/Friend Insurance type: None  Medical Screening Exam [facility] Walk-in ONLY) Medical Exam completed: Yes  Crisis Care Plan Living Arrangements: Parent Legal Guardian: Other: (None) Name of Psychiatrist: None Name of Therapist: None  Education Status Is patient currently in school?: No Current Grade: NA Highest grade of school patient has completed: 711 Name of school: na Contact person: NA  Risk to self with the past 6 months Suicidal Ideation: No Has patient been a risk to self within the past 6 months prior to admission? : No Suicidal Intent: No Has patient had any suicidal intent within the past 6 months prior to admission? : No Is patient at risk for suicide?: No Suicidal Plan?: No Has patient had any suicidal plan within the past 6 months prior to admission? : No Access to Means: No What has been your use of drugs/alcohol within the last 12 months?: Current use Previous Attempts/Gestures: No How many times?: 0 Other Self Harm Risks: None Triggers for Past Attempts:  Unknown Intentional Self Injurious Behavior: None Family Suicide History: No Recent stressful life event(s): Other (Comment) (relationship issues) Persecutory voices/beliefs?: No Depression: Yes Depression Symptoms: Feeling worthless/self pity, Feeling angry/irritable Substance abuse history and/or treatment for substance abuse?: No Suicide prevention information given to non-admitted patients: Not applicable  Risk to Others within the past 6 months Homicidal Ideation: No Does patient have any lifetime risk of violence toward others beyond the six months prior to admission? : No Thoughts of Harm to Others: No Current Homicidal Intent: No Current Homicidal Plan: No Access to Homicidal Means: No Identified Victim: NA History of harm to others?: No Assessment of Violence: None Noted Violent Behavior Description: None Does patient have access to weapons?: No Criminal Charges Pending?: No Does patient have a court date: No Is patient on probation?: No  Psychosis Hallucinations: None noted Delusions: None noted  Mental Status Report Appearance/Hygiene: Unremarkable Eye Contact: Fair Motor Activity: Freedom of movement Speech: Soft, Slow Level of Consciousness: Drowsy Mood: Depressed Affect: Appropriate to circumstance Anxiety Level: Minimal Thought Processes: Coherent, Relevant Judgement: Unimpaired Orientation: Person, Place, Time Obsessive Compulsive Thoughts/Behaviors: None  Cognitive Functioning Concentration: Decreased Memory: Recent Intact, Remote Intact IQ: Average Insight: Fair Impulse Control: Fair Appetite: Poor Weight Loss: 0 Weight Gain: 0 Sleep: Decreased Total Hours  of Sleep: 5 Vegetative Symptoms: None  ADLScreening [facility] Assessment Services) Patient's cognitive ability adequate to safely complete daily activities?: Yes Patient able to express need for assistance with ADLs?: Yes Independently performs ADLs?: Yes (appropriate for developmental  age)  Prior Inpatient Therapy Prior Inpatient Therapy: No Prior Therapy Dates: na Prior Therapy Facilty/Provider(s): na Reason for Treatment: na  Prior Outpatient Therapy Prior Outpatient Therapy: No Prior Therapy Dates: na Prior Therapy Facilty/Provider(s): na Reason for Treatment: na Does patient have an ACCT team?: No Does patient have Intensive In-House Services?  : No Does patient have [facility] services? : No Does patient have P4CC services?: No  ADL Screening (condition at time of admission) Patient's cognitive ability adequate to safely complete daily activities?: Yes Is the patient deaf or have difficulty hearing?: No Does the patient have difficulty seeing, even when wearing glasses/contacts?: No Does the patient have difficulty concentrating, remembering, or making decisions?: No Patient able to express need for assistance with ADLs?: Yes Does the patient have difficulty dressing or bathing?: No Independently performs ADLs?: Yes (appropriate for developmental age) Does the patient have difficulty walking or climbing stairs?: No Weakness of Legs: None Weakness of Arms/Hands: None  Home Assistive Devices/Equipment Home Assistive Devices/Equipment: None  Therapy Consults (therapy consults require a physician order) PT Evaluation Needed: No OT Evalulation Needed: No SLP Evaluation Needed: No Abuse/Neglect Assessment (Assessment to be complete while patient is alone) Physical Abuse: Denies Verbal Abuse: Denies Sexual Abuse: Denies Exploitation of patient/patient's resources: Denies Self-Neglect: Denies Values / Beliefs Cultural Requests During Hospitalization: None Spiritual Requests During Hospitalization: None Consults Spiritual Care Consult Needed: No Social Work Consult Needed: No [employer] officer (For Healthcare) Does patient have an advance directive?: No Would patient like information on creating an advanced directive?: No - patient declined information  (pt declined information) Nutrition Screen- [facility] Patient's home diet: Regular Has the patient recently lost weight without trying?: No Has the patient been eating poorly because of a decreased appetite?: No Malnutrition Screening Tool Score: 0  Additional Information 1:1 In Past 12 Months?: No CIRT Risk: No Elopement Risk: No Does patient have medical clearance?: No     Disposition: Case was staffed with [facility] NP who recommenced patient be monitored overnight for withdrawals or any other MH symptoms. Patient does admit to some ongoing depression and stressors to include relationship issues and lack of employment. Disposition was rendered to [facility] Jeffrey Ege MD who agreed patient required further observation. Patient will be re-evaluated in the a.m.   Disposition Initial Assessment Completed for this Encounter: Yes  On Site Evaluation by:   Reviewed with Physician:    Alfredia Ferguson 12/31/2015 11:29 AM

## 2015-12-31 NOTE — Progress Notes (Signed)
Subjective: He is alert and much more cooperative. He remains intubated. He continues with ventilator support  Objective: Vital signs in last 24 hours: Temp:  [94.8 F (34.9 C)-98.2 F (36.8 C)] 98.2 F (36.8 C) (06/01 0700) Pulse Rate:  [49-84] 66 (06/01 0700) Resp:  [9-27] 13 (06/01 0700) BP: (83-114)/(57-77) 100/65 mmHg (06/01 0700) SpO2:  [100 %] 100 % (06/01 0700) FiO2 (%):  [40 %] 40 % (06/01 0351) Weight:  [65.1 kg (143 lb 8.3 oz)-69.6 kg (153 lb 7 oz)] 69.6 kg (153 lb 7 oz) (06/01 0515) Weight change: -7.476 kg (-16 lb 7.7 oz)    Intake/Output from previous day: 05/31 0701 - 06/01 0700 In: 3224.3 [I.V.:2224.3; IV Piggyback:1000] Out: 1112 [Urine:562]  PHYSICAL EXAM General appearance: alert, no distress and Intubated and on mechanical ventilation Resp: rhonchi bilaterally Cardio: regular rate and rhythm, S1, S2 normal, no murmur, click, rub or gallop GI: soft, non-tender; bowel sounds normal; no masses,  no organomegaly Extremities: extremities normal, atraumatic, no cyanosis or edema  Lab Results:  Results for orders placed or performed during the hospital encounter of 12/30/15 (from the past 48 hour(s))  CBC with Differential/Platelet     Status: Abnormal   Collection Time: 12/30/15  3:44 AM  Result Value Ref Range   WBC 13.4 (H) 4.0 - 10.5 K/uL   RBC 5.09 4.22 - 5.81 MIL/uL   Hemoglobin 15.0 13.0 - 17.0 g/dL   HCT 44.4 39.0 - 52.0 %   MCV 87.2 78.0 - 100.0 fL   MCH 29.5 26.0 - 34.0 pg   MCHC 33.8 30.0 - 36.0 g/dL   RDW 13.8 11.5 - 15.5 %   Platelets 329 150 - 400 K/uL   Neutrophils Relative % 57 %   Neutro Abs 7.7 1.7 - 7.7 K/uL   Lymphocytes Relative 34 %   Lymphs Abs 4.5 (H) 0.7 - 4.0 K/uL   Monocytes Relative 8 %   Monocytes Absolute 1.0 0.1 - 1.0 K/uL   Eosinophils Relative 1 %   Eosinophils Absolute 0.2 0.0 - 0.7 K/uL   Basophils Relative 0 %   Basophils Absolute 0.1 0.0 - 0.1 K/uL  Comprehensive metabolic panel     Status: Abnormal   Collection  Time: 12/30/15  3:44 AM  Result Value Ref Range   Sodium 141 135 - 145 mmol/L   Potassium 3.4 (L) 3.5 - 5.1 mmol/L   Chloride 104 101 - 111 mmol/L   CO2 28 22 - 32 mmol/L   Glucose, Bld 108 (H) 65 - 99 mg/dL   BUN 13 6 - 20 mg/dL   Creatinine, Ser 0.83 0.61 - 1.24 mg/dL   Calcium 9.2 8.9 - 10.3 mg/dL   Total Protein 7.4 6.5 - 8.1 g/dL   Albumin 3.9 3.5 - 5.0 g/dL   AST 33 15 - 41 U/L   ALT 59 17 - 63 U/L   Alkaline Phosphatase 52 38 - 126 U/L   Total Bilirubin 0.4 0.3 - 1.2 mg/dL   GFR calc non Af Amer >60 >60 mL/min   GFR calc Af Amer >60 >60 mL/min    Comment: (NOTE) The eGFR has been calculated using the CKD EPI equation. This calculation has not been validated in all clinical situations. eGFR's persistently <60 mL/min signify possible Chronic Kidney Disease.    Anion gap 9 5 - 15  Ethanol     Status: None   Collection Time: 12/30/15  3:44 AM  Result Value Ref Range   Alcohol, Ethyl (B) <5 <  5 mg/dL    Comment:        LOWEST DETECTABLE LIMIT FOR SERUM ALCOHOL IS 5 mg/dL FOR MEDICAL PURPOSES ONLY   Acetaminophen level     Status: Abnormal   Collection Time: 12/30/15  3:44 AM  Result Value Ref Range   Acetaminophen (Tylenol), Serum <10 (L) 10 - 30 ug/mL    Comment:        THERAPEUTIC CONCENTRATIONS VARY SIGNIFICANTLY. A RANGE OF 10-30 ug/mL MAY BE AN EFFECTIVE CONCENTRATION FOR MANY PATIENTS. HOWEVER, SOME ARE BEST TREATED AT CONCENTRATIONS OUTSIDE THIS RANGE. ACETAMINOPHEN CONCENTRATIONS >150 ug/mL AT 4 HOURS AFTER INGESTION AND >50 ug/mL AT 12 HOURS AFTER INGESTION ARE OFTEN ASSOCIATED WITH TOXIC REACTIONS.   Salicylate level     Status: None   Collection Time: 12/30/15  3:44 AM  Result Value Ref Range   Salicylate Lvl <5.1 2.8 - 30.0 mg/dL  D-dimer, quantitative (not at [facility])     Status: None   Collection Time: 12/30/15  3:44 AM  Result Value Ref Range   D-Dimer, Quant <0.27 0.00 - 0.50 ug/mL-FEU    Comment: (NOTE) At the manufacturer cut-off of 0.50  ug/mL FEU, this assay has been documented to exclude PE with a sensitivity and negative predictive value of 97 to 99%.  At this time, this assay has not been approved by the FDA to exclude DVT/VTE. Results should be correlated with clinical presentation.   Blood gas, arterial ([facility] ONLY)     Status: Abnormal   Collection Time: 12/30/15  3:46 AM  Result Value Ref Range   pH, Arterial 7.444 7.350 - 7.450   pCO2 arterial 42.3 35.0 - 45.0 mmHg   pO2, Arterial 87.8 80.0 - 100.0 mmHg   Bicarbonate 28.8 (H) 20.0 - 24.0 mEq/L   TCO2 19.6 0 - 100 mmol/L   Acid-Base Excess 4.6 (H) 0.0 - 2.0 mmol/L   O2 Saturation 96.8 %   Collection site LEFT RADIAL    Drawn by [id number]    Sample type ARTERIAL DRAW    Allens test (pass/fail) PASS PASS  CBG monitoring, ED     Status: Abnormal   Collection Time: 12/30/15  4:12 AM  Result Value Ref Range   Glucose-Capillary 118 (H) 65 - 99 mg/dL   Comment 1 Notify RN    Comment 2 Document in Chart   Urinalysis, Routine w reflex microscopic (not at [facility])     Status: Abnormal   Collection Time: 12/30/15  4:13 AM  Result Value Ref Range   Color, Urine STRAW (A) YELLOW   APPearance CLEAR CLEAR   Specific Gravity, Urine <1.005 (L) 1.005 - 1.030   pH 6.0 5.0 - 8.0   Glucose, UA NEGATIVE NEGATIVE mg/dL   Hgb urine dipstick NEGATIVE NEGATIVE   Bilirubin Urine NEGATIVE NEGATIVE   Ketones, ur NEGATIVE NEGATIVE mg/dL   Protein, ur NEGATIVE NEGATIVE mg/dL   Nitrite NEGATIVE NEGATIVE   Leukocytes, UA NEGATIVE NEGATIVE    Comment: MICROSCOPIC NOT DONE ON URINES WITH NEGATIVE PROTEIN, BLOOD, LEUKOCYTES, NITRITE, OR GLUCOSE <1000 mg/dL.  Urine rapid drug screen (hosp performed)     Status: None   Collection Time: 12/30/15  4:13 AM  Result Value Ref Range   Opiates NONE DETECTED NONE DETECTED   Cocaine NONE DETECTED NONE DETECTED   Benzodiazepines NONE DETECTED NONE DETECTED   Amphetamines NONE DETECTED NONE DETECTED   Tetrahydrocannabinol NONE DETECTED NONE  DETECTED   Barbiturates NONE DETECTED NONE DETECTED    Comment:  DRUG SCREEN FOR MEDICAL PURPOSES ONLY.  IF CONFIRMATION IS NEEDED FOR ANY PURPOSE, NOTIFY LAB WITHIN 5 DAYS.        LOWEST DETECTABLE LIMITS FOR URINE DRUG SCREEN Drug Class       Cutoff (ng/mL) Amphetamine      1000 Barbiturate      200 Benzodiazepine   240 Tricyclics       973 Opiates          300 Cocaine          300 THC              50   Blood gas, arterial ([facility] ONLY)     Status: Abnormal   Collection Time: 12/30/15  5:52 AM  Result Value Ref Range   FIO2 1.00    O2 Content 100.0 L/min   Delivery systems VENTILATOR    Mode PRESSURE REGULATED VOLUME CONTROL    VT 550 mL   LHR 14.0 resp/min   Peep/cpap 5.0 cm H20   pH, Arterial 7.404 7.350 - 7.450   pCO2 arterial 44.2 35.0 - 45.0 mmHg   pO2, Arterial 467 (H) 80.0 - 100.0 mmHg   Bicarbonate 26.6 (H) 20.0 - 24.0 mEq/L   TCO2 19.7 0 - 100 mmol/L   Acid-Base Excess 2.7 (H) 0.0 - 2.0 mmol/L   O2 Saturation 99.8 %   Collection site RIGHT RADIAL    Drawn by [id number]    Sample type ARTERIAL DRAW    Allens test (pass/fail) PASS PASS  Urine rapid drug screen (hosp performed)     Status: Abnormal   Collection Time: 12/30/15  6:00 AM  Result Value Ref Range   Opiates POSITIVE (A) NONE DETECTED   Cocaine NONE DETECTED NONE DETECTED   Benzodiazepines POSITIVE (A) NONE DETECTED   Amphetamines NONE DETECTED NONE DETECTED   Tetrahydrocannabinol POSITIVE (A) NONE DETECTED   Barbiturates NONE DETECTED NONE DETECTED    Comment:        DRUG SCREEN FOR MEDICAL PURPOSES ONLY.  IF CONFIRMATION IS NEEDED FOR ANY PURPOSE, NOTIFY LAB WITHIN 5 DAYS.        LOWEST DETECTABLE LIMITS FOR URINE DRUG SCREEN Drug Class       Cutoff (ng/mL) Amphetamine      1000 Barbiturate      200 Benzodiazepine   426 Tricyclics       834 Opiates          300 Cocaine          300 THC              50   MRSA PCR Screening     Status: Abnormal   Collection Time: 12/30/15  9:30  AM  Result Value Ref Range   MRSA by PCR POSITIVE (A) NEGATIVE    Comment:        The GeneXpert MRSA Assay (FDA approved for NASAL specimens only), is one component of a comprehensive MRSA colonization surveillance program. It is not intended to diagnose MRSA infection nor to guide or monitor treatment for MRSA infections. RESULT CALLED TO, READ BACK BY AND VERIFIED WITH: CHAPELLE,R. AT 1962 ON 12/20/2015 BY BAUGHAM,M.   Blood gas, arterial     Status: Abnormal   Collection Time: 12/31/15  5:00 AM  Result Value Ref Range   FIO2 40.00    Delivery systems VENTILATOR    Mode PRESSURE REGULATED VOLUME CONTROL    VT 600.0 mL   LHR 12.0 resp/min   Peep/cpap  5.0 cm H20   pH, Arterial 7.358 7.350 - 7.450   pCO2 arterial 48.2 (H) 35.0 - 45.0 mmHg   pO2, Arterial 111.0 (H) 80.0 - 100.0 mmHg   Bicarbonate 25.2 (H) 20.0 - 24.0 mEq/L   TCO2 17.4 0 - 100 mmol/L   Acid-Base Excess 1.5 0.0 - 2.0 mmol/L   O2 Saturation 97.9 %   Patient temperature 37.0    Collection site RIGHT RADIAL    Drawn by [id number]    Sample type ARTERIAL    Allens test (pass/fail) PASS PASS  Basic metabolic panel     Status: Abnormal   Collection Time: 12/31/15  5:18 AM  Result Value Ref Range   Sodium 143 135 - 145 mmol/L   Potassium 3.9 3.5 - 5.1 mmol/L   Chloride 111 101 - 111 mmol/L   CO2 29 22 - 32 mmol/L   Glucose, Bld 88 65 - 99 mg/dL   BUN 12 6 - 20 mg/dL   Creatinine, Ser 0.82 0.61 - 1.24 mg/dL   Calcium 7.9 (L) 8.9 - 10.3 mg/dL   GFR calc non Af Amer >60 >60 mL/min   GFR calc Af Amer >60 >60 mL/min    Comment: (NOTE) The eGFR has been calculated using the CKD EPI equation. This calculation has not been validated in all clinical situations. eGFR's persistently <60 mL/min signify possible Chronic Kidney Disease.    Anion gap 3 (L) 5 - 15  CBC     Status: Abnormal   Collection Time: 12/31/15  5:18 AM  Result Value Ref Range   WBC 11.2 (H) 4.0 - 10.5 K/uL   RBC 4.33 4.22 - 5.81 MIL/uL    Hemoglobin 12.5 (L) 13.0 - 17.0 g/dL   HCT 39.7 39.0 - 52.0 %   MCV 91.7 78.0 - 100.0 fL   MCH 28.9 26.0 - 34.0 pg   MCHC 31.5 30.0 - 36.0 g/dL   RDW 14.6 11.5 - 15.5 %   Platelets 269 150 - 400 K/uL    ABGS  Recent Labs  12/31/15 0500  PHART 7.358  PO2ART 111.0*  TCO2 17.4  HCO3 25.2*   CULTURES Recent Results (from the past 240 hour(s))  MRSA PCR Screening     Status: Abnormal   Collection Time: 12/30/15  9:30 AM  Result Value Ref Range Status   MRSA by PCR POSITIVE (A) NEGATIVE Final    Comment:        The GeneXpert MRSA Assay (FDA approved for NASAL specimens only), is one component of a comprehensive MRSA colonization surveillance program. It is not intended to diagnose MRSA infection nor to guide or monitor treatment for MRSA infections. RESULT CALLED TO, READ BACK BY AND VERIFIED WITH: CHAPELLE,R. AT 1754 ON 12/20/2015 BY BAUGHAM,M.    Studies/Results: Dg Chest 2 View  12/30/2015  CLINICAL DATA:  Weakness, shortness of breath and unsteady gait for 3 days. EXAM: CHEST  2 VIEW COMPARISON:  None. FINDINGS: The cardiomediastinal contours are normal. The lungs are clear. Pulmonary vasculature is normal. No consolidation, pleural effusion, or pneumothorax. No acute osseous abnormalities are seen. IMPRESSION: No acute pulmonary process. Electronically Signed   By: Jeb Levering M.D.   On: 12/30/2015 04:14   Ct Head Wo Contrast  12/30/2015  CLINICAL DATA:  Initial evaluation for acute unresponsiveness. EXAM: CT HEAD WITHOUT CONTRAST TECHNIQUE: Contiguous axial images were obtained from the base of the skull through the vertex without intravenous contrast. COMPARISON:  Prior CT performed earlier the same evening. FINDINGS:  There is no acute intracranial hemorrhage or infarct. No mass lesion or midline shift. Gray-white matter differentiation is well maintained. Ventricles are normal in size without evidence of hydrocephalus. CSF containing spaces are within normal limits.  No extra-axial fluid collection. The calvarium is intact. Orbital soft tissues are within normal limits. Patient is intubated. Paranasal sinuses are largely clear. No mastoid effusion. Scalp soft tissues are unremarkable. IMPRESSION: Negative head CT.  No acute intracranial process identified. Electronically Signed   By: Jeannine Boga M.D.   On: 12/30/2015 07:32   Ct Head Wo Contrast  12/30/2015  CLINICAL DATA:  Altered mental status.  Weakness.  Drowsy. EXAM: CT HEAD WITHOUT CONTRAST TECHNIQUE: Contiguous axial images were obtained from the base of the skull through the vertex without intravenous contrast. COMPARISON:  05/19/2012 FINDINGS: No intracranial hemorrhage, mass effect, or midline shift. No hydrocephalus. The basilar cisterns are patent. No evidence of territorial infarct. No intracranial fluid collection. Calvarium is intact. Included paranasal sinuses and mastoid air cells are well aerated. IMPRESSION: No acute intracranial abnormality. Electronically Signed   By: Jeb Levering M.D.   On: 12/30/2015 04:18   Ct Soft Tissue Neck Wo Contrast  12/30/2015  CLINICAL DATA:  Initial evaluation for acute unresponsiveness, dyspnea and. Question foreign body. EXAM: CT NECK WITHOUT CONTRAST TECHNIQUE: Multidetector CT imaging of the neck was performed following the standard protocol without intravenous contrast. COMPARISON:  None. FINDINGS: Partially visualized brain is within normal limits. Mild mucosal thickening within the inferior maxillary sinuses bilaterally. Paranasal sinuses are otherwise clear. Mastoids are well pneumatized.  Middle ear cavities are clear. Salivary glands including the parotid glands and submandibular glands are normal. Patient is intubated with endotracheal and enteric tubes in place. Oral cavity grossly unremarkable without acute abnormality. No acute abnormality about the dentition. Poor dentition noted. Palatine tonsils grossly unremarkable. Parapharyngeal fat  preserved. Nasopharynx within normal limits. Small amount of layering secretions within the oropharynx posteriorly, likely related to intubation. Evaluation of the hypopharynx and supraglottic larynx inferiorly is fairly limited due to extensive motion artifact. No definite radiopaque foreign body. Subglottic airway is clear. Carina is not visualize. Thyroid normal. No definite adenopathy on this noncontrast and motion degraded study. Visualized lungs are clear Osseous structures grossly unremarkable, although evaluation somewhat limited by motion. No definite acute abnormality. No worrisome lytic or blastic osseous lesions. IMPRESSION: Limited study due to motion with no definite acute abnormality identified within the neck. Small amount of layering secretions within the oropharynx, likely related to intubation. No definite radiopaque foreign body, although again, evaluation limited due to extensive motion artifact on this exam. Electronically Signed   By: Jeannine Boga M.D.   On: 12/30/2015 06:33   Dg Chest Portable 1 View  12/30/2015  CLINICAL DATA:  Endotracheal and orogastric tube placement. Mental status change. EXAM: PORTABLE CHEST 1 VIEW COMPARISON:  Chest radiograph earlier this day. FINDINGS: Endotracheal tube 3.9 cm from the carina. Enteric tube in the distal esophagus. Recommend advancement of 10 cm. The cardiomediastinal contours are normal. The lungs are clear. Pulmonary vasculature is normal. No consolidation, pleural effusion, or pneumothorax. No acute osseous abnormalities are seen. IMPRESSION: 1. Endotracheal tube 3.9 cm from the carina. 2. Enteric tube in the distal esophagus, recommend advancement of 10 cm. 3. Clear lungs. Electronically Signed   By: Jeb Levering M.D.   On: 12/30/2015 05:30    Medications:  Prior to Admission:  Prescriptions prior to admission  Medication Sig Dispense Refill Last Dose  . ibuprofen (ADVIL,MOTRIN) 200  MG tablet Take 600 mg by mouth every 6  (six) hours as needed for moderate pain.   unknown  . [DISCONTINUED] acetaminophen-codeine (TYLENOL #3) 300-30 MG per tablet Take 1-2 tablets by mouth every 6 (six) hours as needed. 20 tablet 0 Completed Course at Unknown time  . [DISCONTINUED] amoxicillin (AMOXIL) 500 MG capsule Take 1 capsule (500 mg total) by mouth 3 (three) times daily. 30 capsule 0   . [DISCONTINUED] clindamycin (CLEOCIN) 150 MG capsule 1 po ac and hs, qid 28 capsule 0    Scheduled: . albuterol  2.5 mg Nebulization Q4H  . antiseptic oral rinse  7 mL Mouth Rinse QID  . chlorhexidine gluconate (SAGE KIT)  15 mL Mouth Rinse BID  . Chlorhexidine Gluconate Cloth  6 each Topical Q0600  . enoxaparin (LOVENOX) injection  40 mg Subcutaneous Q24H  . mupirocin ointment  1 application Nasal BID  . pantoprazole (PROTONIX) IV  40 mg Intravenous QHS  . pneumococcal 23 valent vaccine  0.5 mL Intramuscular Tomorrow-1000   Continuous: . 0.9 % NaCl with KCl 20 mEq / L 100 mL/hr at 12/31/15 0525  . fentaNYL infusion INTRAVENOUS 75 mcg/hr (12/31/15 0700)  . midazolam (VERSED) infusion 2 mg/hr (12/31/15 0700)  . propofol (DIPRIVAN) infusion Stopped (12/30/15 0606)   LSL:HTDSKA chloride, acetaminophen, albuterol, ondansetron (ZOFRAN) IV  Assesment:He was admitted with acute hypoxic respiratory failure with acute encephalopathy presumably from drug overdose. This morning he is much more awake and alert. Active Problems:   Respiratory failure (HCC)   Drug overdose   Acute respiratory failure with hypoxia (HCC)   Acute encephalopathy   Tobacco use disorder   Hypokalemia   Overdose    Plan: Attempt weaning and extubation today    LOS: 1 day   Kdyn Vonbehren L 12/31/2015, 7:35 AM

## 2015-12-31 NOTE — Progress Notes (Signed)
PROGRESS NOTE    Jeffrey Schultz  EKC:[id number] DOB: Jul 05, 1981 DOA: 12/30/2015 PCP: No PCP Per Patient   Brief Narrative:  35 y.o. male presented with reports of shortness of breath and anxiety. On admission was noted to be lethargic but able to ambulate to the bathroom. Per EDP after patient returned form the bathroom he was noted to become more somnolent, cyanotic, and mottled. He began to destat and required intubation. It was suspected he took some type of drug in the restroom. No response to narcan. His girlfriend admitted that initial urine submitted did not belong to the patient. He hs been referred for admission for suspected overdose. Repeat UDS revealed benzodiazepines, opiates and THC. Initial UDS negative, CXR negative, No CO2 retention on ABG. D-dimer negative.    Assessment & Plan:   Active Problems:   Respiratory failure (HCC)   Drug overdose   Acute respiratory failure with hypoxia (HCC)   Acute encephalopathy   Tobacco use disorder   Hypokalemia   Overdose  1. Acute respiratory failure secondary to drug overdose. Remains intubated, plan for weaning trial and extubation today. UDS positive for benzodiazepines, opiates and THC. CXR showed clear lungs and ABG unremarkable. 2. Possible depression. Family has reported that they feel he has become increasingly depressed over the last month. 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 Family unaware of any SI, but he will likely need a psychiatry consult after extubation.  3. Leukocytosis. Likely reactive.Improving.  4. Hypokalemia. Replaced. 5. Tobacco use. Will counsel on cessation once he is extubated.    DVT prophylaxis: Lovenox  Code Status: Full Family Communication: no family present Disposition Plan: Anticipate discharge in 24 hours.    Consultants:   None   Procedures:   Intubation 5/31>>  Antimicrobials:   None     Subjective: Intubated, but awake and answering questions   Objective: Filed Vitals:   12/31/15 0430 12/31/15 0445 12/31/15 0500 12/31/15 0515  BP: 100/60 1'07/58 98/64 97/57 '  Pulse: 65 72 83 69  Temp: 97.9 F (36.6 C) 97.9 F (36.6 C) 97.9 F (36.6 C) 97.9 F (36.6 C)  TempSrc:      Resp: '11 16 13 17  ' Height:      Weight:    69.6 kg (153 lb 7 oz)  SpO2: 100% 100% 100% 100%    Intake/Output Summary (Last 24 hours) at 12/31/15 0643 Last data filed at 12/31/15 0500  Gross per 24 hour  Intake 2581.63 ml  Output   1112 ml  Net 1469.63 ml   Filed Weights   12/30/15 0206 12/30/15 0848 12/31/15 0515  Weight: 72.576 kg (160 lb) 65.1 kg (143 lb 8.3 oz) 69.6 kg (153 lb 7 oz)    Examination:  General exam: Appears calm and comfortable  Respiratory system: Clear to auscultation. Respiratory effort normal. Cardiovascular system: S1 & S2 heard, RRR. No JVD, murmurs, rubs, gallops or clicks. No pedal edema. Gastrointestinal system: Abdomen is nondistended, soft and nontender. No organomegaly or masses felt. Normal bowel sounds heard. Central nervous system: Alert and oriented. No focal neurological deficits. Extremities: Symmetric 5 x 5 power. Skin: No rashes, lesions or ulcers Psychiatry: Mood & affect appropriate.     Data Reviewed: I have personally reviewed following labs and imaging studies  CBC:  Recent Labs Lab 12/30/15 0344 12/31/15 0518  WBC 13.4* 11.2*  NEUTROABS 7.7  --   HGB 15.0 12.5*  HCT 44.4  39.7  MCV 87.2 91.7  PLT 329 778   Basic Metabolic Panel:  Recent Labs Lab 12/30/15 0344 12/31/15 0518  NA 141 143  K 3.4* 3.9  CL 104 111  CO2 28 29  GLUCOSE 108* 88  BUN 13 12  CREATININE 0.83 0.82  CALCIUM 9.2 7.9*   GFR: Estimated Creatinine Clearance: 127.3 mL/min (by C-G formula based on Cr of 0.82). Liver Function Tests:  Recent Labs Lab 12/30/15 0344  AST 33  ALT 59  ALKPHOS 52  BILITOT 0.4  PROT 7.4  ALBUMIN 3.9   No results  for input(s): LIPASE, AMYLASE in the last 168 hours. No results for input(s): AMMONIA in the last 168 hours. Coagulation Profile: No results for input(s): INR, PROTIME in the last 168 hours. Cardiac Enzymes: No results for input(s): CKTOTAL, CKMB, CKMBINDEX, TROPONINI in the last 168 hours. BNP (last 3 results) No results for input(s): PROBNP in the last 8760 hours. HbA1C: No results for input(s): HGBA1C in the last 72 hours. CBG:  Recent Labs Lab 12/30/15 0412  GLUCAP 118*   Lipid Profile: No results for input(s): CHOL, HDL, LDLCALC, TRIG, CHOLHDL, LDLDIRECT in the last 72 hours. Thyroid Function Tests: No results for input(s): TSH, T4TOTAL, FREET4, T3FREE, THYROIDAB in the last 72 hours. Anemia Panel: No results for input(s): VITAMINB12, FOLATE, FERRITIN, TIBC, IRON, RETICCTPCT in the last 72 hours. Urine analysis:    Component Value Date/Time   COLORURINE STRAW* 12/30/2015 0413   APPEARANCEUR CLEAR 12/30/2015 0413   LABSPEC <1.005* 12/30/2015 0413   PHURINE 6.0 12/30/2015 0413   GLUCOSEU NEGATIVE 12/30/2015 0413   HGBUR NEGATIVE 12/30/2015 0413   BILIRUBINUR NEGATIVE 12/30/2015 0413   KETONESUR NEGATIVE 12/30/2015 0413   PROTEINUR NEGATIVE 12/30/2015 0413   NITRITE NEGATIVE 12/30/2015 0413   LEUKOCYTESUR NEGATIVE 12/30/2015 0413   Sepsis Labs: '@LABRCNTIP' (procalcitonin:4,lacticidven:4)  ) Recent Results (from the past 240 hour(s))  MRSA PCR Screening     Status: Abnormal   Collection Time: 12/30/15  9:30 AM  Result Value Ref Range Status   MRSA by PCR POSITIVE (A) NEGATIVE Final    Comment:        The GeneXpert MRSA Assay (FDA approved for NASAL specimens only), is one component of a comprehensive MRSA colonization surveillance program. It is not intended to diagnose MRSA infection nor to guide or monitor treatment for MRSA infections. RESULT CALLED TO, READ BACK BY AND VERIFIED WITH: CHAPELLE,R. AT 2423 ON 12/20/2015 BY BAUGHAM,M.           Radiology Studies: Dg Chest 2 View  12/30/2015  CLINICAL DATA:  Weakness, shortness of breath and unsteady gait for 3 days. EXAM: CHEST  2 VIEW COMPARISON:  None. FINDINGS: The cardiomediastinal contours are normal. The lungs are clear. Pulmonary vasculature is normal. No consolidation, pleural effusion, or pneumothorax. No acute osseous abnormalities are seen. IMPRESSION: No acute pulmonary process. Electronically Signed   By: Jeb Levering M.D.   On: 12/30/2015 04:14   Ct Head Wo Contrast  12/30/2015  CLINICAL DATA:  Initial evaluation for acute unresponsiveness. EXAM: CT HEAD WITHOUT CONTRAST TECHNIQUE: Contiguous axial images were obtained from the base of the skull through the vertex without intravenous contrast. COMPARISON:  Prior CT performed earlier the same evening. FINDINGS: There is no acute intracranial hemorrhage or infarct. No mass lesion or midline shift. Gray-white matter differentiation is well maintained. Ventricles are normal in size without evidence of hydrocephalus. CSF containing spaces are within normal limits. No extra-axial fluid collection. The calvarium is  intact. Orbital soft tissues are within normal limits. Patient is intubated. Paranasal sinuses are largely clear. No mastoid effusion. Scalp soft tissues are unremarkable. IMPRESSION: Negative head CT.  No acute intracranial process identified. Electronically Signed   By: Jeannine Boga M.D.   On: 12/30/2015 07:32   Ct Head Wo Contrast  12/30/2015  CLINICAL DATA:  Altered mental status.  Weakness.  Drowsy. EXAM: CT HEAD WITHOUT CONTRAST TECHNIQUE: Contiguous axial images were obtained from the base of the skull through the vertex without intravenous contrast. COMPARISON:  05/19/2012 FINDINGS: No intracranial hemorrhage, mass effect, or midline shift. No hydrocephalus. The basilar cisterns are patent. No evidence of territorial infarct. No intracranial fluid collection. Calvarium is intact. Included paranasal  sinuses and mastoid air cells are well aerated. IMPRESSION: No acute intracranial abnormality. Electronically Signed   By: Jeb Levering M.D.   On: 12/30/2015 04:18   Ct Soft Tissue Neck Wo Contrast  12/30/2015  CLINICAL DATA:  Initial evaluation for acute unresponsiveness, dyspnea and. Question foreign body. EXAM: CT NECK WITHOUT CONTRAST TECHNIQUE: Multidetector CT imaging of the neck was performed following the standard protocol without intravenous contrast. COMPARISON:  None. FINDINGS: Partially visualized brain is within normal limits. Mild mucosal thickening within the inferior maxillary sinuses bilaterally. Paranasal sinuses are otherwise clear. Mastoids are well pneumatized.  Middle ear cavities are clear. Salivary glands including the parotid glands and submandibular glands are normal. Patient is intubated with endotracheal and enteric tubes in place. Oral cavity grossly unremarkable without acute abnormality. No acute abnormality about the dentition. Poor dentition noted. Palatine tonsils grossly unremarkable. Parapharyngeal fat preserved. Nasopharynx within normal limits. Small amount of layering secretions within the oropharynx posteriorly, likely related to intubation. Evaluation of the hypopharynx and supraglottic larynx inferiorly is fairly limited due to extensive motion artifact. No definite radiopaque foreign body. Subglottic airway is clear. Carina is not visualize. Thyroid normal. No definite adenopathy on this noncontrast and motion degraded study. Visualized lungs are clear Osseous structures grossly unremarkable, although evaluation somewhat limited by motion. No definite acute abnormality. No worrisome lytic or blastic osseous lesions. IMPRESSION: Limited study due to motion with no definite acute abnormality identified within the neck. Small amount of layering secretions within the oropharynx, likely related to intubation. No definite radiopaque foreign body, although again, evaluation  limited due to extensive motion artifact on this exam. Electronically Signed   By: Jeannine Boga M.D.   On: 12/30/2015 06:33   Dg Chest Portable 1 View  12/30/2015  CLINICAL DATA:  Endotracheal and orogastric tube placement. Mental status change. EXAM: PORTABLE CHEST 1 VIEW COMPARISON:  Chest radiograph earlier this day. FINDINGS: Endotracheal tube 3.9 cm from the carina. Enteric tube in the distal esophagus. Recommend advancement of 10 cm. The cardiomediastinal contours are normal. The lungs are clear. Pulmonary vasculature is normal. No consolidation, pleural effusion, or pneumothorax. No acute osseous abnormalities are seen. IMPRESSION: 1. Endotracheal tube 3.9 cm from the carina. 2. Enteric tube in the distal esophagus, recommend advancement of 10 cm. 3. Clear lungs. Electronically Signed   By: Jeb Levering M.D.   On: 12/30/2015 05:30        Scheduled Meds: . albuterol  2.5 mg Nebulization Q4H  . antiseptic oral rinse  7 mL Mouth Rinse QID  . chlorhexidine gluconate (SAGE KIT)  15 mL Mouth Rinse BID  . Chlorhexidine Gluconate Cloth  6 each Topical Q0600  . enoxaparin (LOVENOX) injection  40 mg Subcutaneous Q24H  . mupirocin ointment  1 application Nasal BID  .  pantoprazole (PROTONIX) IV  40 mg Intravenous QHS  . pneumococcal 23 valent vaccine  0.5 mL Intramuscular Tomorrow-1000   Continuous Infusions: . 0.9 % NaCl with KCl 20 mEq / L 100 mL/hr at 12/31/15 0525  . fentaNYL infusion INTRAVENOUS 20 mcg/hr (12/31/15 0424)  . midazolam (VERSED) infusion 2 mg/hr (12/31/15 0424)  . propofol (DIPRIVAN) infusion Stopped (12/30/15 0606)     LOS: 1 day    Time spent: 46mns   Thang Flett, MD  [facility] Pager [phone] If 7PM-7AM, please contact night-coverage [email or web address] Password [email or web address] 12/31/2015, 6:43 AM     By signing my name below, I, JRennis Harding attest that this documentation has been prepared under the direction and in the presence of  JKathie Dike MD. Electronically signed: JRennis Harding Scribe. 12/31/2015   I, Dr. JKathie Dike personally performed the services described in this documentaiton. All medical record entries made by the scribe were at my direction and in my presence. I have reviewed the chart and agree that the record reflects my personal performance and is accurate and complete  JKathie Dike MD, 12/31/2015 10:01 AM

## 2015-12-31 NOTE — Procedures (Signed)
**Note De-Identified Brinn Westby Obfuscation** Extubation Procedure Note  Patient Details:   Name: Jeffrey Schultz DOB: May 02, 1981 MRN: [id number]   Airway Documentation:     Evaluation  O2 sats: stable throughout Complications: No apparent complications Patient did tolerate procedure well. Bilateral Breath Sounds: Clear, Diminished   Yes + leak, parameters WNL Yemariam Magar, Megan SalonWendy Cooper 12/31/2015, 10:10 AM

## 2015-12-31 NOTE — Clinical Social Work Note (Signed)
Clinical Social Work Assessment  Patient Details  Name: Jeffrey Schultz MRN: [id number] Date of Birth: 1980/08/28  Date of referral:  12/31/15               Reason for consult:  Substance Use/ETOH Abuse                Permission sought to share information with:    Permission granted to share information::     Name::        Agency::     Relationship::     Contact Information:     Housing/Transportation Living arrangements for the past 2 months:  Single Family Home Source of Information:  Patient, Parent Patient Interpreter Needed:  None Criminal Activity/Legal Involvement Pertinent to Current Situation/Hospitalization:  No - Comment as needed Significant Relationships:  Dependent Children, Parents Lives with:  Parents Do you feel safe going back to the place where you live?  Yes Need for family participation in patient care:  Yes (Comment)  Care giving concerns:  None identified.    Social Worker assessment / plan:  Patient's mother, [name], was at bedside. Patient gave CSW permission to speak with about referral in presence of his mother. CSW discussed that referral was made to address concerns regarding his overdose/substance abuse.  Patient stated that his overdose was accidental. He admitted to using "benzos" for the past "year or so." Patient stated that he is currently unemployed, lives with his mother and has four sons. CSW discussed treatment readiness and treatment options.  Patient advised that he has never been to treatment for substance abuse. He stated that he was not interested in going in to inpatient or outpatient treatment at this time.  Patient stated "After today, I am not taking nothing that don't belong to me.  I don't want to end up here or dead."  Patient was accepting of information provided by CSW (SA treatment providers). Patient's mother advised that if patient chose to go to treatment, she would assist him with arranging appointments. CSW signing off.    Employment status:  Unemployed Health and safety inspectornsurance information:  Self Pay (Medicaid Pending) PT Recommendations:  Not assessed at this time Information / Referral to community resources:  Residential Substance Abuse Treatment Options, Outpatient Substance Abuse Treatment Options  Patient/Family's Response to care:  Patient is not ready for SA treatment at this time.   Patient/Family's Understanding of and Emotional Response to Diagnosis, Current Treatment, and Prognosis:  Patient understands his diagnosis, treatment and prognosis.   Emotional Assessment Appearance:  Appears stated age Attitude/Demeanor/Rapport:   (Cooperative) Affect (typically observed):  Calm, Accepting Orientation:  Oriented to Self, Oriented to Place, Oriented to  Time, Oriented to Situation Alcohol / Substance use:    Psych involvement (Current and /or in the community):  Yes (Comment) (TTS consulted today.)  Discharge Needs  Concerns to be addressed:  No discharge needs identified Readmission within the last 30 days:  No Current discharge risk:  None Barriers to Discharge:  No Barriers Identified   Annice NeedySettle, [name], LCSW 12/31/2015, 12:32 PM

## 2015-12-31 NOTE — Progress Notes (Signed)
Wasted 200mg  fentanyl and 30mg  of versed in sink with L.Schonewitz, RN
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
## 2016-01-01 DIAGNOSIS — T424X4A Poisoning by benzodiazepines, undetermined, initial encounter: Secondary | ICD-10-CM

## 2016-01-01 DIAGNOSIS — T391X4A Poisoning by 4-Aminophenol derivatives, undetermined, initial encounter: Secondary | ICD-10-CM

## 2016-01-01 NOTE — Progress Notes (Signed)
He was able to be successfully extubated yesterday and is still off the ventilator. He seems back to what I suspect is his baseline mental status. He is awake and alert. He does not have any pulmonary disease at baseline although he does have significant smoking history. I will plan to sign off at this point. Thank you very much for allowing me to see him with you

## 2016-01-01 NOTE — Care Management Note (Signed)
Case Management Note  Patient Details  Name: Catha Nottinghamimothy O Mcclimans MRN: [id number] Date of Birth: 1981/01/10  Subjective/Objective:                  Pt is from home, lives with his parents. Pt is ind with ADL's. Pt uninsured and has no PCP. Pt discharging home today. No med assistance needs anticipated.   Action/Plan: [facility]. Pt given list of PCP's in [place] that will see uninsured pt's.Discharging home today.    Expected Discharge Date:      01/01/2016            Expected Discharge Plan:  Home/Self Care  In-House Referral:  Clinical Social Work  Discharge planning Services  CM Consult  Post Acute Care Choice:  NA Choice offered to:  NA  DME Arranged:    DME Agency:     HH Arranged:    HH Agency:     Status of Service:  Completed, signed off  Medicare Important Message Given:    Date Medicare IM Given:    Medicare IM give by:    Date Additional Medicare IM Given:    Additional Medicare Important Message give by:     If discussed at Long Length of Stay Meetings, dates discussed:    Additional Comments:  Malcolm MetroChildress, Kamryn Gauthier Demske, RN 01/01/2016, 11:24 AM

## 2016-01-01 NOTE — Discharge Summary (Signed)
Physician Discharge Summary  Jeffrey Nottinghamimothy O Bartolini ZOX:[id number] DOB: 1980-08-31 DOA: 12/30/2015  PCP: No PCP Per Patient  Admit date: 12/30/2015 Discharge date: 01/01/2016  Time spent: 35 minutes  Recommendations for Outpatient Follow-up:  1. Follow up with PCP in 1-2 weeks.  2. Patient will be discharged home with outpatient resources for outpatient psychotherapy   Discharge Diagnoses:  Principal Problem:   Drug overdose Active Problems:   Respiratory failure (HCC)   Acute respiratory failure with hypoxia (HCC)   Acute encephalopathy   Tobacco use disorder   Hypokalemia   Overdose   Discharge Condition: improved  Diet recommendation: regular  Filed Weights   12/30/15 0848 12/31/15 0515 01/01/16 0500  Weight: 65.1 kg (143 lb 8.3 oz) 69.6 kg (153 lb 7 oz) 69.6 kg (153 lb 7 oz)    History of present illness:  Jeffrey Schultz is a 35 y.o. male presented with reports of shortness of breath and anxiety. Patient currently intubated and unable to provide history. Per mother he has recently moved back into parents' home but their history of acute respiratory distress and AMS is also limited. The past few days he had been increasingly anxious, restless, auditory hallucinations, vomiting, and SOB with wheezes. She denied any cough and fever. He had recently been increasingly stressed and depressed after his four children were removed from his care by social services. Mother reported that he had gone out last night and did not return. He was brought to the ED for AMS. Mother reported one episode of seizure years ago without any recurrence. She also but denies any chronic medical or mental issues. He does not have a PCP.   Hospital Course:  Patient was admitted to the hospital for unintentional overdose. During his course to the ER, he began to desaturate, was hypoventilating and became increasingly lethargic. He was intubated for airway protection and respiratory support. He was kept on the  ventilator for 24 hours and was extubated the following day without any complications. He was aggressively hydrated with fluids and it appears that the effects of the drugs that he took has subsided. The patient did not have any complaints following day. He was seen by psychiatry who did not feel he was an imminent risk to himself or others. It was not felt that he was a candidate for inpatient psychiatry and they recommended outpatient psychiatry follow-up. Social worker has seen the patient and provided appropriate resources. Patient is otherwise stable for discharge.   Procedures:  Intubation 5/31>> 6/01  Consultations:  Pulmonology  Discharge Exam: Filed Vitals:   01/01/16 0500 01/01/16 0800  BP:    Pulse: 83   Temp:  97.1 F (36.2 C)  Resp: 21     Examination:  General exam: Appears calm and comfortable  Respiratory system: Clear to auscultation. Respiratory effort normal. Cardiovascular system: S1 & S2 heard, RRR. No JVD, murmurs, rubs, gallops or clicks. No pedal edema. Gastrointestinal system: Abdomen is nondistended, soft and nontender. No organomegaly or masses felt. Normal bowel sounds heard. Central nervous system: Alert and oriented. No focal neurological deficits. Extremities: Symmetric 5 x 5 power. Skin: No rashes, lesions or ulcers Psychiatry: Judgement and insight appear normal. Mood & affect appropriate.    Discharge Instructions   Discharge Instructions    Diet - low sodium heart healthy    Complete by:  As directed      Increase activity slowly    Complete by:  As directed  Current Discharge Medication List    CONTINUE these medications which have NOT CHANGED   Details  ibuprofen (ADVIL,MOTRIN) 200 MG tablet Take 600 mg by mouth every 6 (six) hours as needed for moderate pain.       No Known Allergies    The results of significant diagnostics from this hospitalization (including imaging, microbiology, ancillary and laboratory) are  listed below for reference.    Significant Diagnostic Studies: Dg Chest 2 View  12/30/2015  CLINICAL DATA:  Weakness, shortness of breath and unsteady gait for 3 days. EXAM: CHEST  2 VIEW COMPARISON:  None. FINDINGS: The cardiomediastinal contours are normal. The lungs are clear. Pulmonary vasculature is normal. No consolidation, pleural effusion, or pneumothorax. No acute osseous abnormalities are seen. IMPRESSION: No acute pulmonary process. Electronically Signed   By: Rubye Oaks M.D.   On: 12/30/2015 04:14   Ct Head Wo Contrast  12/30/2015  CLINICAL DATA:  Initial evaluation for acute unresponsiveness. EXAM: CT HEAD WITHOUT CONTRAST TECHNIQUE: Contiguous axial images were obtained from the base of the skull through the vertex without intravenous contrast. COMPARISON:  Prior CT performed earlier the same evening. FINDINGS: There is no acute intracranial hemorrhage or infarct. No mass lesion or midline shift. Gray-white matter differentiation is well maintained. Ventricles are normal in size without evidence of hydrocephalus. CSF containing spaces are within normal limits. No extra-axial fluid collection. The calvarium is intact. Orbital soft tissues are within normal limits. Patient is intubated. Paranasal sinuses are largely clear. No mastoid effusion. Scalp soft tissues are unremarkable. IMPRESSION: Negative head CT.  No acute intracranial process identified. Electronically Signed   By: Rise Mu M.D.   On: 12/30/2015 07:32   Ct Head Wo Contrast  12/30/2015  CLINICAL DATA:  Altered mental status.  Weakness.  Drowsy. EXAM: CT HEAD WITHOUT CONTRAST TECHNIQUE: Contiguous axial images were obtained from the base of the skull through the vertex without intravenous contrast. COMPARISON:  05/19/2012 FINDINGS: No intracranial hemorrhage, mass effect, or midline shift. No hydrocephalus. The basilar cisterns are patent. No evidence of territorial infarct. No intracranial fluid collection.  Calvarium is intact. Included paranasal sinuses and mastoid air cells are well aerated. IMPRESSION: No acute intracranial abnormality. Electronically Signed   By: Rubye Oaks M.D.   On: 12/30/2015 04:18   Ct Soft Tissue Neck Wo Contrast  12/30/2015  CLINICAL DATA:  Initial evaluation for acute unresponsiveness, dyspnea and. Question foreign body. EXAM: CT NECK WITHOUT CONTRAST TECHNIQUE: Multidetector CT imaging of the neck was performed following the standard protocol without intravenous contrast. COMPARISON:  None. FINDINGS: Partially visualized brain is within normal limits. Mild mucosal thickening within the inferior maxillary sinuses bilaterally. Paranasal sinuses are otherwise clear. Mastoids are well pneumatized.  Middle ear cavities are clear. Salivary glands including the parotid glands and submandibular glands are normal. Patient is intubated with endotracheal and enteric tubes in place. Oral cavity grossly unremarkable without acute abnormality. No acute abnormality about the dentition. Poor dentition noted. Palatine tonsils grossly unremarkable. Parapharyngeal fat preserved. Nasopharynx within normal limits. Small amount of layering secretions within the oropharynx posteriorly, likely related to intubation. Evaluation of the hypopharynx and supraglottic larynx inferiorly is fairly limited due to extensive motion artifact. No definite radiopaque foreign body. Subglottic airway is clear. Carina is not visualize. Thyroid normal. No definite adenopathy on this noncontrast and motion degraded study. Visualized lungs are clear Osseous structures grossly unremarkable, although evaluation somewhat limited by motion. No definite acute abnormality. No worrisome lytic or blastic osseous lesions. IMPRESSION:  Limited study due to motion with no definite acute abnormality identified within the neck. Small amount of layering secretions within the oropharynx, likely related to intubation. No definite radiopaque  foreign body, although again, evaluation limited due to extensive motion artifact on this exam. Electronically Signed   By: Rise Mu M.D.   On: 12/30/2015 06:33   Dg Chest Portable 1 View  12/30/2015  CLINICAL DATA:  Endotracheal and orogastric tube placement. Mental status change. EXAM: PORTABLE CHEST 1 VIEW COMPARISON:  Chest radiograph earlier this day. FINDINGS: Endotracheal tube 3.9 cm from the carina. Enteric tube in the distal esophagus. Recommend advancement of 10 cm. The cardiomediastinal contours are normal. The lungs are clear. Pulmonary vasculature is normal. No consolidation, pleural effusion, or pneumothorax. No acute osseous abnormalities are seen. IMPRESSION: 1. Endotracheal tube 3.9 cm from the carina. 2. Enteric tube in the distal esophagus, recommend advancement of 10 cm. 3. Clear lungs. Electronically Signed   By: Rubye Oaks M.D.   On: 12/30/2015 05:30    Microbiology: Recent Results (from the past 240 hour(s))  MRSA PCR Screening     Status: Abnormal   Collection Time: 12/30/15  9:30 AM  Result Value Ref Range Status   MRSA by PCR POSITIVE (A) NEGATIVE Final    Comment:        The GeneXpert MRSA Assay (FDA approved for NASAL specimens only), is one component of a comprehensive MRSA colonization surveillance program. It is not intended to diagnose MRSA infection nor to guide or monitor treatment for MRSA infections. RESULT CALLED TO, READ BACK BY AND VERIFIED WITH: CHAPELLE,R. AT 1754 ON 12/20/2015 BY BAUGHAM,M.      Labs: Basic Metabolic Panel:  Recent Labs Lab 12/30/15 0344 12/31/15 0518  NA 141 143  K 3.4* 3.9  CL 104 111  CO2 28 29  GLUCOSE 108* 88  BUN 13 12  CREATININE 0.83 0.82  CALCIUM 9.2 7.9*   Liver Function Tests:  Recent Labs Lab 12/30/15 0344  AST 33  ALT 59  ALKPHOS 52  BILITOT 0.4  PROT 7.4  ALBUMIN 3.9   No results for input(s): LIPASE, AMYLASE in the last 168 hours. No results for input(s): AMMONIA in the  last 168 hours. CBC:  Recent Labs Lab 12/30/15 0344 12/31/15 0518  WBC 13.4* 11.2*  NEUTROABS 7.7  --   HGB 15.0 12.5*  HCT 44.4 39.7  MCV 87.2 91.7  PLT 329 269   Cardiac Enzymes: No results for input(s): CKTOTAL, CKMB, CKMBINDEX, TROPONINI in the last 168 hours. BNP: BNP (last 3 results) No results for input(s): BNP in the last 8760 hours.  ProBNP (last 3 results) No results for input(s): PROBNP in the last 8760 hours.  CBG:  Recent Labs Lab 12/30/15 0412  GLUCAP 118*    Signed:  Erick Blinks, MD.  [facility] 01/01/2016, 11:56 AM

## 2016-01-01 NOTE — BH Assessment (Signed)
Clinician contacted Pt RN Casimiro Needle(Michael) to inform him that Fransisca KaufmannLaura Davis, NP was aware of Psych Eval request. NP is currently assessing other Pts and should be available within approx. 90mins.

## 2016-01-01 NOTE — Consult Note (Signed)
Telepsych Consultation   Reason for Consult: Medication overdose Referring Physician: EDP Patient Identification: Jeffrey Schultz MRN:  [id number] Principal Diagnosis: Drug overdose Diagnosis:   Patient Active Problem List   Diagnosis Date Noted  . Respiratory failure ([place]) [J96.90] 12/30/2015  . Drug overdose [T50.901A] 12/30/2015  . Acute respiratory failure with hypoxia ([place]) [J96.01] 12/30/2015  . Acute encephalopathy [G93.40] 12/30/2015  . Tobacco use disorder [F17.200] 12/30/2015  . Hypokalemia [E87.6] 12/30/2015  . Overdose [T50.901A] 12/30/2015    Total Time spent with patient: 30 minutes  Subjective:   Jeffrey Schultz is a 35 y.o. male patient admitted with altered mental status and shortness of breath to [facility] on 12/30/2015.   HPI:   Jeffrey Schultz is an 35 y.o. male that presented to [facility] per notes, reporting shortness of breath and increased anxiety. Patient was escorted by friends on admission with patient becoming disoriented while in [facility] waiting area. Patient denies any S/I or H/I and stated he does not remember coming to the hospital. Patient reports mild symptoms of depression recently due to "taking a break" from his wife and four children. The patient is currently residing with his mother and is unable to provide details about the status of the relationship. He reports having to exert more effort to function over the last week. Patient denies any suicidal ideation or past attempts. He denies receiving any current psychiatric care. Patient reports that his friends "peer pressured me into taking some pills. I was just trying to relax. I think it almost killed me. It was Klonopin and Vicodin. I had a reaction from what I've been told in the lobby and ended up here in the ICU. I am feeling like myself again and want to go home. I will work through this situation with my wife with family support." His mother Jeffrey Schultz was present during the assessment and denied  having any safety concerns about her son being discharged home with her. Patient's mood and affect were appropriate during the assessment. He denied any suicidal ideation several times and denies regular use of illicit drugs. On 12/30/2015 his urine drug screen was positive for benzo, opiates, and marijuana, which is what the patient reported that he had taken in addition to marijuana. Discussed with patient starting outpatient treatment with a psychotherapist to address his family stressors but patient did not agree with the recommendation. Writer informed patient that he would be discharged with outpatient resources upon discharge to address depressive symptoms and stressors. Discussed case with Dr. Shea Evans who concurs that the patient is stable for discharge to home.   Past Psychiatric History: Patient states "I had some problems with depression ten years ago."   Risk to Self: Suicidal Ideation: No Suicidal Intent: No Is patient at risk for suicide?: No Suicidal Plan?: No Access to Means: No What has been your use of drugs/alcohol within the last 12 months?: Current use How many times?: 0 Other Self Harm Risks: None Triggers for Past Attempts: Unknown Intentional Self Injurious Behavior: None Risk to Others: Homicidal Ideation: No Thoughts of Harm to Others: No Current Homicidal Intent: No Current Homicidal Plan: No Access to Homicidal Means: No Identified Victim: NA History of harm to others?: No Assessment of Violence: None Noted Violent Behavior Description: None Does patient have access to weapons?: No Criminal Charges Pending?: No Does patient have a court date: No Prior Inpatient Therapy: Prior Inpatient Therapy: No Prior Therapy Dates: na Prior Therapy Facilty/Provider(s): na Reason for Treatment: na Prior  Outpatient Therapy: Prior Outpatient Therapy: No Prior Therapy Dates: na Prior Therapy Facilty/Provider(s): na Reason for Treatment: na Does patient have an ACCT team?:  No Does patient have Intensive In-House Services?  : No Does patient have [facility] services? : No Does patient have [facility] services?: No  Past Medical History:  Past Medical History  Diagnosis Date  . Depression   . Anxiety    History reviewed. No pertinent past surgical history. Family History: History reviewed. No pertinent family history. Family Psychiatric  History: Denies Social History:  History  Alcohol Use No     History  Drug Use  . Yes  . Special: Heroin, Benzodiazepines, Marijuana    Social History   Social History  . Marital Status: Single    Spouse Name: N/A  . Number of Children: N/A  . Years of Education: N/A   Social History Main Topics  . Smoking status: Current Every Day Smoker -- 1.00 packs/day  . Smokeless tobacco: None  . Alcohol Use: No  . Drug Use: Yes    Special: Heroin, Benzodiazepines, Marijuana  . Sexual Activity: Not Asked   Other Topics Concern  . None   Social History Narrative   Additional Social History:    Allergies:  No Known Allergies  Labs:  Results for orders placed or performed during the hospital encounter of 12/30/15 (from the past 48 hour(s))  Blood gas, arterial     Status: Abnormal   Collection Time: 12/31/15  5:00 AM  Result Value Ref Range   FIO2 40.00    Delivery systems VENTILATOR    Mode PRESSURE REGULATED VOLUME CONTROL    VT 600.0 mL   LHR 12.0 resp/min   Peep/cpap 5.0 cm H20   pH, Arterial 7.358 7.350 - 7.450   pCO2 arterial 48.2 (H) 35.0 - 45.0 mmHg   pO2, Arterial 111.0 (H) 80.0 - 100.0 mmHg   Bicarbonate 25.2 (H) 20.0 - 24.0 mEq/L   TCO2 17.4 0 - 100 mmol/L   Acid-Base Excess 1.5 0.0 - 2.0 mmol/L   O2 Saturation 97.9 %   Patient temperature 37.0    Collection site RIGHT RADIAL    Drawn by [id number]    Sample type ARTERIAL    Allens test (pass/fail) PASS PASS  Basic metabolic panel     Status: Abnormal   Collection Time: 12/31/15  5:18 AM  Result Value Ref Range   Sodium 143 135 - 145 mmol/L    Potassium 3.9 3.5 - 5.1 mmol/L   Chloride 111 101 - 111 mmol/L   CO2 29 22 - 32 mmol/L   Glucose, Bld 88 65 - 99 mg/dL   BUN 12 6 - 20 mg/dL   Creatinine, Ser 0.82 0.61 - 1.24 mg/dL   Calcium 7.9 (L) 8.9 - 10.3 mg/dL   GFR calc non Af Amer >60 >60 mL/min   GFR calc Af Amer >60 >60 mL/min    Comment: (NOTE) The eGFR has been calculated using the CKD EPI equation. This calculation has not been validated in all clinical situations. eGFR's persistently <60 mL/min signify possible Chronic Kidney Disease.    Anion gap 3 (L) 5 - 15  CBC     Status: Abnormal   Collection Time: 12/31/15  5:18 AM  Result Value Ref Range   WBC 11.2 (H) 4.0 - 10.5 K/uL   RBC 4.33 4.22 - 5.81 MIL/uL   Hemoglobin 12.5 (L) 13.0 - 17.0 g/dL   HCT 39.7 39.0 - 52.0 %   MCV 91.7  78.0 - 100.0 fL   MCH 28.9 26.0 - 34.0 pg   MCHC 31.5 30.0 - 36.0 g/dL   RDW 14.6 11.5 - 15.5 %   Platelets 269 150 - 400 K/uL    Current Facility-Administered Medications  Medication Dose Route Frequency Provider Last Rate Last Dose  . 0.9 %  sodium chloride infusion  250 mL Intravenous PRN Kathie Dike, MD      . acetaminophen (TYLENOL) tablet 650 mg  650 mg Oral Q4H PRN Kathie Dike, MD      . albuterol (PROVENTIL) (2.5 MG/3ML) 0.083% nebulizer solution 2.5 mg  2.5 mg Nebulization Q2H PRN Kathie Dike, MD      . Chlorhexidine Gluconate Cloth 2 % PADS 6 each  6 each Topical Q0600 Kathie Dike, MD   6 each at 01/01/16 0600  . enoxaparin (LOVENOX) injection 40 mg  40 mg Subcutaneous Q24H Kathie Dike, MD   40 mg at 12/30/15 1052  . mupirocin ointment (BACTROBAN) 2 % 1 application  1 application Nasal BID Kathie Dike, MD   1 application at 56/81/27 0813  . ondansetron (ZOFRAN) injection 4 mg  4 mg Intravenous Q6H PRN Kathie Dike, MD      . pneumococcal 23 valent vaccine (PNU-IMMUNE) injection 0.5 mL  0.5 mL Intramuscular Tomorrow-1000 Orvan Falconer, MD   0.5 mL at 12/31/15 1000    Musculoskeletal:  Unable to assess via  camera   Psychiatric Specialty Exam: Physical Exam  Review of Systems  Constitutional: Negative.   HENT: Negative.   Eyes: Negative.   Respiratory: Negative.   Cardiovascular: Negative.   Gastrointestinal: Negative.   Genitourinary: Negative.   Musculoskeletal: Negative.   Skin: Negative.   Neurological: Negative.   Endo/Heme/Allergies: Negative.   Psychiatric/Behavioral: Positive for depression and substance abuse. Negative for suicidal ideas, hallucinations and memory loss. The patient is not nervous/anxious and does not have insomnia.     Blood pressure 96/62, pulse 83, temperature 97.1 F (36.2 C), temperature source Oral, resp. rate 21, height '5\' 11"'  (1.803 m), weight 69.6 kg (153 lb 7 oz), SpO2 99 %.Body mass index is 21.41 kg/(m^2).  General Appearance: Casual  Eye Contact:  Good  Speech:  Clear and Coherent  Volume:  Normal  Mood:  Euthymic  Affect:  Appropriate  Thought Process:  Coherent  Orientation:  Full (Time, Place, and Person)  Thought Content:  WDL  Suicidal Thoughts:  No  Homicidal Thoughts:  No  Memory:  Immediate;   Good Recent;   Good Remote;   Good  Judgement:  Fair  Insight:  Present  Psychomotor Activity:  Normal  Concentration:  Concentration: Good and Attention Span: Good  Recall:  Good  Fund of Knowledge:  Good  Language:  Good  Akathisia:  No  Handed:  Right  AIMS (if indicated):     Assets:  Communication Skills Desire for Improvement Housing Intimacy Leisure Time Physical Health Resilience Social Support  ADL's:  Intact  Cognition:  WNL  Sleep:        Treatment Plan Summary: Patient has been medically cleared at this time by Dr. Luan Pulling this morning. He appears stable to discharge home with outpatient resources for outpatient psychotherapy.   Disposition: No evidence of imminent risk to self or others at present.   Patient does not meet criteria for psychiatric inpatient admission. Supportive therapy provided about ongoing  stressors. Discussed crisis plan, support from social network, calling 911, coming to the Emergency Department, and calling Suicide Hotline.  Sheniqua Carolan, Mickel Baas,  NP 01/01/2016 11:01 AM

## 2016-11-02 ENCOUNTER — Emergency Department (HOSPITAL_COMMUNITY)
Admission: EM | Admit: 2016-11-02 | Discharge: 2016-11-02 | Disposition: A | Discharge status: Home / Self Care | Payer: Self-pay | Attending: Emergency Medicine | Admitting: Emergency Medicine

## 2016-11-02 ENCOUNTER — Encounter (HOSPITAL_COMMUNITY): Payer: Self-pay | Admitting: Emergency Medicine

## 2016-11-02 ENCOUNTER — Emergency Department (HOSPITAL_COMMUNITY): Payer: Self-pay

## 2016-11-02 DIAGNOSIS — F17200 Nicotine dependence, unspecified, uncomplicated: Secondary | ICD-10-CM | POA: Insufficient documentation

## 2016-11-02 DIAGNOSIS — Z5181 Encounter for therapeutic drug level monitoring: Secondary | ICD-10-CM | POA: Insufficient documentation

## 2016-11-02 DIAGNOSIS — T50901A Poisoning by unspecified drugs, medicaments and biological substances, accidental (unintentional), initial encounter: Secondary | ICD-10-CM

## 2016-11-02 DIAGNOSIS — T50991A Poisoning by other drugs, medicaments and biological substances, accidental (unintentional), initial encounter: Secondary | ICD-10-CM | POA: Insufficient documentation

## 2016-11-02 DIAGNOSIS — F1910 Other psychoactive substance abuse, uncomplicated: Secondary | ICD-10-CM | POA: Insufficient documentation

## 2016-11-02 HISTORY — DX: Other psychoactive substance abuse, uncomplicated: F19.10

## 2016-11-02 HISTORY — DX: Poisoning by unspecified drugs, medicaments and biological substances, accidental (unintentional), initial encounter: T50.901A

## 2016-11-02 LAB — CBC WITH DIFFERENTIAL/PLATELET
BASOS ABS: 0 10*3/uL (ref 0.0–0.1)
BASOS PCT: 0 %
EOS ABS: 0 10*3/uL (ref 0.0–0.7)
EOS PCT: 0 %
HCT: 41.9 % (ref 39.0–52.0)
Hemoglobin: 14.4 g/dL (ref 13.0–17.0)
LYMPHS ABS: 1.9 10*3/uL (ref 0.7–4.0)
LYMPHS PCT: 13 %
MCH: 29.3 pg (ref 26.0–34.0)
MCHC: 34.4 g/dL (ref 30.0–36.0)
MCV: 85.3 fL (ref 78.0–100.0)
Monocytes Absolute: 0.7 10*3/uL (ref 0.1–1.0)
Monocytes Relative: 5 %
NEUTROS ABS: 12.3 10*3/uL — AB (ref 1.7–7.7)
Neutrophils Relative %: 82 %
PLATELETS: 284 10*3/uL (ref 150–400)
RBC: 4.91 MIL/uL (ref 4.22–5.81)
RDW: 14 % (ref 11.5–15.5)
WBC: 14.9 10*3/uL — ABNORMAL HIGH (ref 4.0–10.5)

## 2016-11-02 LAB — MAGNESIUM: Magnesium: 1.8 mg/dL (ref 1.7–2.4)

## 2016-11-02 LAB — COMPREHENSIVE METABOLIC PANEL
ALT: 52 U/L (ref 17–63)
AST: 32 U/L (ref 15–41)
Albumin: 4.1 g/dL (ref 3.5–5.0)
Alkaline Phosphatase: 53 U/L (ref 38–126)
Anion gap: 9 (ref 5–15)
BUN: 14 mg/dL (ref 6–20)
CHLORIDE: 99 mmol/L — AB (ref 101–111)
CO2: 30 mmol/L (ref 22–32)
CREATININE: 0.74 mg/dL (ref 0.61–1.24)
Calcium: 9.3 mg/dL (ref 8.9–10.3)
GFR calc Af Amer: >60 mL/min (ref >60)
GFR calc non Af Amer: >60 mL/min (ref >60)
GLUCOSE: 95 mg/dL (ref 65–99)
Potassium: 3.1 mmol/L — ABNORMAL LOW (ref 3.5–5.1)
SODIUM: 138 mmol/L (ref 135–145)
Total Bilirubin: 0.5 mg/dL (ref 0.3–1.2)
Total Protein: 7.4 g/dL (ref 6.5–8.1)

## 2016-11-02 LAB — ETHANOL: Alcohol, Ethyl (B): <5 mg/dL (ref <5)

## 2016-11-02 LAB — RAPID URINE DRUG SCREEN, HOSP PERFORMED
AMPHETAMINES: NOT DETECTED
BENZODIAZEPINES: POSITIVE — AB
Barbiturates: NOT DETECTED
COCAINE: NOT DETECTED
Opiates: POSITIVE — AB
Tetrahydrocannabinol: NOT DETECTED

## 2016-11-02 LAB — LACTIC ACID, PLASMA: Lactic Acid, Venous: 1.4 mmol/L (ref 0.5–1.9)

## 2016-11-02 LAB — TROPONIN I: Troponin I: <0.03 ng/mL (ref <0.03)

## 2016-11-02 LAB — SALICYLATE LEVEL: Salicylate Lvl: <7 mg/dL (ref 2.8–30.0)

## 2016-11-02 LAB — ACETAMINOPHEN LEVEL

## 2016-11-02 MED ORDER — POTASSIUM CHLORIDE CRYS ER 20 MEQ PO TBCR
40.0000 meq | EXTENDED_RELEASE_TABLET | Freq: Once | ORAL | Status: AC
Start: 2016-11-02 — End: 2016-11-02
  Administered 2016-11-02: 40 meq via ORAL
  Filled 2016-11-02: qty 2

## 2016-11-02 NOTE — Discharge Instructions (Signed)
Substance Abuse Treatment Programs ° °Intensive Outpatient Programs °[facility]     °[address]      °[place], [place]                   °[phone]      ° °[facility] °[address] °[place], [place] °[phone] ° °[facility]     °(Inpatient and outpatient)     °[address]           °[phone]   ° °[facility] °[phone] (Suboxone and Methadone) ° °[address]      °[place], [place] [address]      °336-882-2125      ° °[address] °[place], [place] °852-3033 ° °[facility] (Outpatient/Inpatient, Chemical)    °(insurance only) [phone]      °       °[facility] (Groups & Residential) °[place], [place] °[phone] ° °   °[facility]     °[address]     °[place], [place]      °[phone]      ° °[facility] (for caregivers and family) °[address] °[place], [place] °336-299-4655 ° ° ° ° ° °Residential Treatment Programs °[facility]      °[address], [place], [place] [address]  °([phone]      ° °[facility] °[address] [place], [place] [address] °[phone] ° °[facility]        °336-248-8914      ° °[facility] °[phone] ° °[facility]             °[address]                                         °[place], [place]                                                °[phone] or 336-784-9470                              ° °[facility] [place] °[address] °[place], [address] °1.877.941.8954 ° °[facility]    °[address]      °, [place]     °[phone]      ° °[facility] °[address] °, [place] °336-285-9073 ° °[facility]   °[address]     °[place], [place] [address]     °[phone]      °Admissions: 8am-3pm M-F ° °[facility] °[address] °[place] °336-227-7417 ° °[facility]: Residential Program (90 Days)   °[place], [place]      °336-725-8389 or 800-758-6077    ° °[facility] °[place], [place] °(Walk in Hours over the weekend or by referral) ° °[facility] °[address], [place], [place] [address] °(336) [phone] ° °Crisis Mobile: [facility]:  1-877-626-1772 (for crisis response 24 hours a day) °[facility] Hotline:      1-800-256-2452 °Outpatient Psychiatry and Counseling ° °[facility]: Mobile Crisis   Management 24 hours:  1-877-626-1772 ° °[facility] sliding scale fee and walk in schedule: M-F 8am-12pm/1pm-3pm °[address]  °[place], [place] [address] °[phone] ° °[facility] °[address] °[place], [place] [address] °336-703-9650 ° °[facility] ([facility])- new patient walk-in appointments available Monday - Friday 8am -3pm.          °[address] °[place], [place] [address] °336-676-6840 or crisis line- 336-676-6905 ° °[facility] Intensive Outpatient Therapy Program °[address] °[place], [place] [address] °336-832-9804 ° °[facility]                  °Crisis Services      °[phone]      °[address]     °[place], [place] [address]                ° °[facility]   °[facility] °[phone] °[address] °[place], [place] [address] ° ° °[facility]          °[address],  °[place], [place] [address]       °(336) [phone] ° °[facility] °[address] °[place], [place] [address] °336-292-1510 ° °[facility]    °[address]    °[place], [place] [address]     °[phone]      ° °Parish McKinney, MD     °[address]     °[place] [address]     °336-282-1251     °  °[facility] °[address] °[place] [address] ° °[facility]     °[address]     °[place], [place]      °336-542-2076      ° °[facility] °Shamsher Ahluwalia, MD °[address] °[place], [place] [address] °336-420-9558 ° °[facility]     °[address]     °[place], [place] [address]     °336-274-1237      ° °[facility] °[address] °[place], [place] [address] °336-854-4450 °Resources for Temporary Residential Assistance/Crisis Centers ° °DAY CENTERS °[facility] °M-F 8am-3pm   °[address] [place], [place] [address]   336-332-0824 °Services include: laundry, barbering, support groups, case management, phone  & computer access, showers, AA/NA mtgs, mental health/substance abuse nurse, job skills class, disability information, VA assistance, spiritual classes, etc.  ° °HOMELESS SHELTERS ° °[facility]     °[facility]   °[address], [place]     °336.271.5959       °       °[facility] (women and children)       °[address] °[place], [place] [address] °336-275-0820 °[email or web address] for application and process °Application Required ° °[facility]   °[address]    °[place] [address]     °[phone]       °             °[facility] °[address] °[place], [place] [address] °336.273.5572 °336-235-0363(schedule application appt.) °Application Required ° °[facility] (women only)    °[address]     °[place], [place] [address]     °336-884-1039      °  Intake starts 6pm daily Need valid ID, SSC, & Police report [employer] [address] [place], [place] [phone] Application Required  [facility] (men only)     [address]      [place], [place]     [phone]       [facility] (Pregnant women only) [address] [place], [place] [phone]  [facility]      [address]      [place], [place] [address]     [phone]             [facility] [address] [place], [place] [phone] 90 day commitment/SA/Application process  [facility](men only)     [address]     [place], [place]     [phone]       Check-in at [facility] [address] [place], [place] [address] [phone] Men/Women/Women and Children must be there by 7 pm  [facility] [place], [place] [phone]                  Take your usual prescriptions as previously directed.  Call your regular medical doctor tomorrow to schedule a follow up appointment within the next 2 days. Call the substance abuse resources given to you today if you are interested in detox programs.  Return to the Emergency Department immediately sooner if worsening.

## 2016-11-02 NOTE — ED Provider Notes (Signed)
[facility]-EMERGENCY DEPT Provider Note   CSN: [id number] Arrival date & time: 11/02/16  1554     History   Chief Complaint Chief Complaint  Patient presents with  . Drug Overdose    HPI Jeffrey Schultz is a 36 y.o. male.  HPI  Pt was seen at 1600. Per EMS, Police, and pt report: EMS called to scene for unresponsive pt. Allegedly no pulse and CPR started. Pt was given narcan and immediately woke up. Pt A&O on arrival to ED. States he "crushed up and snorted" what "was supposed to be a hydrocodone." Pt has hx of substance abuse and drug overdose. Pt's only complaint is pain in his anterior chest/sternal area "where they were pressing on it." Denies SOB, no back pain, no abd pain, no N/V/D, no focal motor weakness, no tingling/numbness in extremities.   Past Medical History:  Diagnosis Date  . Anxiety   . Depression   . Drug overdose   . Substance abuse     Patient Active Problem List   Diagnosis Date Noted  . Respiratory failure (HCC) 12/30/2015  . Drug overdose 12/30/2015  . Acute respiratory failure with hypoxia (HCC) 12/30/2015  . Acute encephalopathy 12/30/2015  . Tobacco use disorder 12/30/2015  . Hypokalemia 12/30/2015  . Overdose 12/30/2015    History reviewed. No pertinent surgical history.     Home Medications    Prior to Admission medications   Medication Sig Start Date End Date Taking? Authorizing Provider  ibuprofen (ADVIL,MOTRIN) 200 MG tablet Take 600 mg by mouth every 6 (six) hours as needed for moderate pain.   Yes Historical Provider, MD    Family History No family history on file.  Social History Social History  Substance Use Topics  . Smoking status: Current Every Day Smoker    Packs/day: 1.00  . Smokeless tobacco: Former Neurosurgeon  . Alcohol use No     Allergies   Patient has no known allergies.   Review of Systems Review of Systems ROS: Statement: All systems negative except as marked or noted in the HPI; Constitutional: Negative for  fever and chills. ; ; Eyes: Negative for eye pain, redness and discharge. ; ; ENMT: Negative for ear pain, hoarseness, nasal congestion, sinus pressure and sore throat. ; ; Cardiovascular: Negative for palpitations, diaphoresis, dyspnea and peripheral edema. ; ; Respiratory: Negative for cough, wheezing and stridor. ; ; Gastrointestinal: Negative for nausea, vomiting, diarrhea, abdominal pain, blood in stool, hematemesis, jaundice and rectal bleeding. . ; ; Genitourinary: Negative for dysuria, flank pain and hematuria. ; ; Musculoskeletal: +chest wall pain. Negative for back pain and neck pain. Negative for swelling.; ; Skin: Negative for pruritus, rash, abrasions, blisters, bruising and skin lesion.; ; Neuro: Negative for headache, lightheadedness and neck stiffness. Negative for weakness, altered level of consciousness, altered mental status, extremity weakness, paresthesias, involuntary movement, seizure and syncope.      Physical Exam Updated Vital Signs BP (!) 137/98 (BP Location: Right Arm)   Pulse (!) 103   Temp 97.7 F (36.5 C) (Oral)   Resp 18   Wt 153 lb (69.4 kg)   SpO2 97%   BMI 21.34 kg/m   Physical Exam 1605: Physical examination:  Nursing notes reviewed; Vital signs and O2 SAT reviewed;  Constitutional: Well developed, Well nourished, Well hydrated, In no acute distress; Head:  Normocephalic, atraumatic; Eyes: EOMI, PERRL, No scleral icterus; ENMT: Mouth and pharynx normal, Mucous membranes moist; Neck: Supple, Full range of motion, No lymphadenopathy; Cardiovascular: Regular rate and  rhythm, No gallop; Respiratory: Breath sounds clear & equal bilaterally, No wheezes.  Speaking full sentences with ease, Normal respiratory effort/excursion; Chest: +sternum and bilat parasternal areas tender to palp. No deformity, no soft tissue crepitus. No ecchymosis, no open wounds. Movement normal; Abdomen: Soft, Nontender, Nondistended, Normal bowel sounds; Genitourinary: No CVA tenderness;  Extremities: Pulses normal, No tenderness, No edema, No calf edema or asymmetry.; Neuro: AA&Ox3, Major CN grossly intact.  Speech clear. No gross focal motor or sensory deficits in extremities.; Skin: Color normal, Warm, Dry.   ED Treatments / Results  Labs (all labs ordered are listed, but only abnormal results are displayed)   EKG  EKG Interpretation  Date/Time:  Wednesday November 02 2016 16:00:41 EDT Ventricular Rate:  102 PR Interval:    QRS Duration: 93 QT Interval:  380 QTC Calculation: 495 R Axis:   93 Text Interpretation:  Sinus tachycardia Probable left atrial enlargement Borderline right axis deviation Borderline T wave abnormalities Prolonged QT interval Baseline wander When compared with ECG of 10/30/2015 QT has lengthened Nonspecific ST and T wave abnormality is now Present Confirmed by [facility]  MD, Nicholos Johns [phone]) on 11/02/2016 4:11:23 PM       Radiology   Procedures Procedures (including critical care time)  Medications Ordered in ED Medications - No data to display   Initial Impression / Assessment and Plan / ED Course  I have reviewed the triage vital signs and the nursing notes.  Pertinent labs & imaging results that were available during my care of the patient were reviewed by me and considered in my medical decision making (see chart for details).  MDM Reviewed: previous chart, nursing note and vitals Reviewed previous: labs and ECG Interpretation: labs, x-ray and ECG   Results for orders placed or performed during the hospital encounter of 11/02/16  Acetaminophen level  Result Value Ref Range   Acetaminophen (Tylenol), Serum <10 (L) 10 - 30 ug/mL  Comprehensive metabolic panel  Result Value Ref Range   Sodium 138 135 - 145 mmol/L   Potassium 3.1 (L) 3.5 - 5.1 mmol/L   Chloride 99 (L) 101 - 111 mmol/L   CO2 30 22 - 32 mmol/L   Glucose, Bld 95 65 - 99 mg/dL   BUN 14 6 - 20 mg/dL   Creatinine, Ser 9.56 0.61 - 1.24 mg/dL   Calcium 9.3 8.9 - 21.3  mg/dL   Total Protein 7.4 6.5 - 8.1 g/dL   Albumin 4.1 3.5 - 5.0 g/dL   AST 32 15 - 41 U/L   ALT 52 17 - 63 U/L   Alkaline Phosphatase 53 38 - 126 U/L   Total Bilirubin 0.5 0.3 - 1.2 mg/dL   GFR calc non Af Amer >60 >60 mL/min   GFR calc Af Amer >60 >60 mL/min   Anion gap 9 5 - 15  Ethanol  Result Value Ref Range   Alcohol, Ethyl (B) <5 <5 mg/dL  Salicylate level  Result Value Ref Range   Salicylate Lvl <7.0 2.8 - 30.0 mg/dL  CBC with Differential  Result Value Ref Range   WBC 14.9 (H) 4.0 - 10.5 K/uL   RBC 4.91 4.22 - 5.81 MIL/uL   Hemoglobin 14.4 13.0 - 17.0 g/dL   HCT 08.6 57.8 - 46.9 %   MCV 85.3 78.0 - 100.0 fL   MCH 29.3 26.0 - 34.0 pg   MCHC 34.4 30.0 - 36.0 g/dL   RDW 62.9 52.8 - 41.3 %   Platelets 284 150 - 400 K/uL  Neutrophils Relative % 82 %   Neutro Abs 12.3 (H) 1.7 - 7.7 K/uL   Lymphocytes Relative 13 %   Lymphs Abs 1.9 0.7 - 4.0 K/uL   Monocytes Relative 5 %   Monocytes Absolute 0.7 0.1 - 1.0 K/uL   Eosinophils Relative 0 %   Eosinophils Absolute 0.0 0.0 - 0.7 K/uL   Basophils Relative 0 %   Basophils Absolute 0.0 0.0 - 0.1 K/uL  Urine rapid drug screen (hosp performed)  Result Value Ref Range   Opiates POSITIVE (A) NONE DETECTED   Cocaine NONE DETECTED NONE DETECTED   Benzodiazepines POSITIVE (A) NONE DETECTED   Amphetamines NONE DETECTED NONE DETECTED   Tetrahydrocannabinol NONE DETECTED NONE DETECTED   Barbiturates NONE DETECTED NONE DETECTED  Lactic acid, plasma  Result Value Ref Range   Lactic Acid, Venous 1.4 0.5 - 1.9 mmol/L  Magnesium  Result Value Ref Range   Magnesium 1.8 1.7 - 2.4 mg/dL  Troponin I  Result Value Ref Range   Troponin I <0.03 <0.03 ng/mL   Dg Chest Port 1 View Result Date: 11/02/2016 CLINICAL DATA:  Overdose EXAM: PORTABLE CHEST 1 VIEW COMPARISON:  12/30/2015 FINDINGS: The heart size and mediastinal contours are within normal limits. Both lungs are clear. The visualized skeletal structures are unremarkable. IMPRESSION:  No active disease. Electronically Signed   By: Elige Ko   On: 11/02/2016 16:42    2000:  Pt has remained A&O, at his baseline, while in the ED.  Pt has tol PO well without N/V. Pt has ambulated with steady gait. Potassium repleted PO. Known polysubstance abuse on UDS. Workup otherwise reassuring.  Pt would like to go home now. No clear indication for admission at this time.  Dx and testing d/w pt and family.  Questions answered.  Verb understanding, agreeable to d/c home with outpt f/u.   Final Clinical Impressions(s) / ED Diagnoses   Final diagnoses:  None    New Prescriptions New Prescriptions   No medications on file     Samuel Jester, DO 11/05/16 2341

## 2016-11-02 NOTE — ED Triage Notes (Signed)
Per EMS: called to seen where patient was unresponsive with no pulse. Patient was given narcan and immediately woke up. Patient alert and oriented on arrival to ED. Patient states that he crushed percocet 5 and snorted it.

## 2016-11-02 NOTE — ED Notes (Signed)
Pt ambulated without any assistance. Pt was given sprite.

## 2017-01-18 ENCOUNTER — Emergency Department (HOSPITAL_COMMUNITY): Payer: Self-pay

## 2017-01-18 ENCOUNTER — Emergency Department (HOSPITAL_COMMUNITY)
Admission: EM | Admit: 2017-01-18 | Discharge: 2017-01-18 | Disposition: A | Discharge status: Home / Self Care | Payer: Self-pay | Attending: Emergency Medicine | Admitting: Emergency Medicine

## 2017-01-18 ENCOUNTER — Encounter (HOSPITAL_COMMUNITY): Payer: Self-pay | Admitting: *Deleted

## 2017-01-18 DIAGNOSIS — F17210 Nicotine dependence, cigarettes, uncomplicated: Secondary | ICD-10-CM | POA: Insufficient documentation

## 2017-01-18 DIAGNOSIS — B9789 Other viral agents as the cause of diseases classified elsewhere: Secondary | ICD-10-CM

## 2017-01-18 DIAGNOSIS — J069 Acute upper respiratory infection, unspecified: Secondary | ICD-10-CM | POA: Insufficient documentation

## 2017-01-18 MED ORDER — BENZONATATE 100 MG PO CAPS: 100.0000 mg | ORAL_CAPSULE | Freq: Three times a day (TID) | ORAL | 0 refills | Status: DC

## 2017-01-18 MED ORDER — ALBUTEROL SULFATE HFA 108 (90 BASE) MCG/ACT IN AERS: 1.0000 | INHALATION_SPRAY | Freq: Four times a day (QID) | RESPIRATORY_TRACT | 0 refills | Status: DC | PRN

## 2017-01-18 NOTE — ED Provider Notes (Signed)
[facility]-EMER[facility]Y DEPT Provider Note   CSN: [id number] Arrival date & time: 01/18/17  1932     History   Chief Complaint Chief Complaint  Patient presents with  . Cough    HPI Jeffrey Schultz is a 36 y.o. male.  HPI  Patient presents to ED for productive cough that began approximately 5 days ago. He states that the cough is productive with thick greenish sputum. He reports some shortness of breath associated with the cough. Also reports associated ear pain/popping and generalized myalgias. He has taken Mucinex, Sudafed and Robitussin with no relief of symptoms. He denies chest pain, fever, hemoptysis, sick contacts, recent travel, history of TB, history of COPD or asthma, history of HIV, rhinorrhea, sinus pain, headaches, leg swelling, nausea or vomiting.  Past Medical History:  Diagnosis Date  . Anxiety   . Depression   . Drug overdose   . Substance abuse     Patient Active Problem List   Diagnosis Date Noted  . Respiratory failure (HCC) 12/30/2015  . Drug overdose 12/30/2015  . Acute respiratory failure with hypoxia (HCC) 12/30/2015  . Acute encephalopathy 12/30/2015  . Tobacco use disorder 12/30/2015  . Hypokalemia 12/30/2015  . Overdose 12/30/2015    History reviewed. No pertinent surgical history.     Home Medications    Prior to Admission medications   Medication Sig Start Date End Date Taking? Authorizing Provider  albuterol (PROVENTIL HFA;VENTOLIN HFA) 108 (90 Base) MCG/ACT inhaler Inhale 1-2 puffs into the lungs every 6 (six) hours as needed for wheezing or shortness of breath. 01/18/17   Vikas Wegmann, PA-C  benzonatate (TESSALON) 100 MG capsule Take 1 capsule (100 mg total) by mouth every 8 (eight) hours. 01/18/17   Alysah Carton, PA-C  ibuprofen (ADVIL,MOTRIN) 200 MG tablet Take 600 mg by mouth every 6 (six) hours as needed for moderate pain.    [provider]    Family History No family history on file.  Social History Social History    Substance Use Topics  . Smoking status: Current Every Day Smoker    Packs/day: 1.00  . Smokeless tobacco: Former NeurosurgeonUser  . Alcohol use No     Allergies   Patient has no known allergies.   Review of Systems Review of Systems  Constitutional: Negative for appetite change, chills and fever.  HENT: Positive for ear pain. Negative for drooling, facial swelling, hearing loss, rhinorrhea, sinus pain, sinus pressure, sore throat and tinnitus.   Eyes: Negative for itching.  Respiratory: Positive for cough and shortness of breath. Negative for chest tightness.   Cardiovascular: Negative for chest pain and palpitations.  Gastrointestinal: Negative for abdominal pain, nausea and vomiting.  Musculoskeletal: Positive for myalgias.  Skin: Negative for rash.  Neurological: Negative for headaches.     Physical Exam Updated Vital Signs BP (!) 128/92 (BP Location: Right Arm)   Pulse 95   Temp 98.6 F (37 C) (Oral) Comment (Src): Simultaneous filing. User may not have seen previous data.  Resp 18   Ht 5\' 8"  (1.727 m)   Wt 73.5 kg (162 lb)   SpO2 96%   BMI 24.63 kg/m   Physical Exam  Constitutional: He appears well-developed and well-nourished. No distress.  HENT:  Head: Normocephalic and atraumatic.  Right Ear: Tympanic membrane and external ear normal.  Left Ear: Tympanic membrane and external ear normal.  Mouth/Throat: Uvula is midline and oropharynx is clear and moist. No tonsillar exudate.  Eyes: Conjunctivae and EOM are normal. No scleral icterus.  Neck: Normal range of motion.  Cardiovascular: Normal rate and regular rhythm.   Pulmonary/Chest: Effort normal. No respiratory distress. He exhibits no tenderness.  Mild coarse breath sounds present on the base of the left lung. No respiratory distress noted.   Neurological: He is alert.  Skin: No rash noted. He is not diaphoretic.  Psychiatric: He has a normal mood and affect.  Nursing note and vitals reviewed.    ED Treatments  / Results  Labs (all labs ordered are listed, but only abnormal results are displayed) Labs Reviewed - No data to display  EKG  EKG Interpretation None       Radiology Dg Chest 2 View  Result Date: 01/18/2017 CLINICAL DATA:  Acute onset of productive cough and shortness of breath. Initial encounter. EXAM: CHEST  2 VIEW COMPARISON:  Chest radiograph performed 11/02/2016 FINDINGS: The lungs are well-aerated. Mild peribronchial thickening is noted. There is no evidence of focal opacification, pleural effusion or pneumothorax. The heart is normal in size; the mediastinal contour is within normal limits. No acute osseous abnormalities are seen. IMPRESSION: Mild peribronchial thickening noted.  Lungs otherwise grossly clear. Electronically Signed   By: Roanna Raider M.D.   On: 01/18/2017 20:04    Procedures Procedures (including critical care time)  Medications Ordered in ED Medications - No data to display   Initial Impression / Assessment and Plan / ED Course  I have reviewed the triage vital signs and the nursing notes.  Pertinent labs & imaging results that were available during my care of the patient were reviewed by me and considered in my medical decision making (see chart for details).     Patient presents to ED for complaints of cough that began approximately 5 days ago. He states that the cough is productive with thick greenish sputum but denies any fevers, chest pain or hemoptysis. He reports some associated shortness of breath due to the cough. Denies history of asthma, COPD, recent travel, TB or HIV. He has tried symptomatic over-the-counter medications with no relief. On physical exam he has coarse breath sounds present in the left lower lung field. He does not appear in respiratory distress and is satting at 96%. X-ray showed mild peribronchial thickening. We will treat symptomatically for bronchitis with antitussives and albuterol as needed. Advised to follow-up with PCP  for further evaluation. Strict return precautions given.  Final Clinical Impressions(s) / ED Diagnoses   Final diagnoses:  Viral URI with cough    New Prescriptions New Prescriptions   ALBUTEROL (PROVENTIL HFA;VENTOLIN HFA) 108 (90 BASE) MCG/ACT INHALER    Inhale 1-2 puffs into the lungs every 6 (six) hours as needed for wheezing or shortness of breath.   BENZONATATE (TESSALON) 100 MG CAPSULE    Take 1 capsule (100 mg total) by mouth every 8 (eight) hours.     Dietrich Pates, PA-C 01/18/17 2102    Loren Racer, MD 01/22/17 [phone]

## 2017-01-18 NOTE — ED Triage Notes (Signed)
Pt c/o cough that is productive with green sputum that started Friday, denies any fever, n/v,

## 2017-01-18 NOTE — Discharge Instructions (Signed)
Take Tessalon as needed for cough. Use albuterol as needed for wheezing or shortness of breath. Follow-up with PCP for further evaluation. Return to ED for worsening since his, chest pain, trouble breathing, high fevers with productive cough, increased wheezing, trouble swallowing.

## 2018-01-25 ENCOUNTER — Encounter (HOSPITAL_COMMUNITY): Payer: Self-pay

## 2018-01-25 ENCOUNTER — Emergency Department (HOSPITAL_COMMUNITY)
Admission: EM | Admit: 2018-01-25 | Discharge: 2018-01-26 | Disposition: A | Discharge status: Home / Self Care | Payer: Self-pay | Attending: Emergency Medicine | Admitting: Emergency Medicine

## 2018-01-25 DIAGNOSIS — T401X1A Poisoning by heroin, accidental (unintentional), initial encounter: Secondary | ICD-10-CM | POA: Insufficient documentation

## 2018-01-25 DIAGNOSIS — F17210 Nicotine dependence, cigarettes, uncomplicated: Secondary | ICD-10-CM | POA: Insufficient documentation

## 2018-01-25 NOTE — ED Provider Notes (Signed)
[facility] EMERGENCY DEPARTMENT Provider Note   CSN: [id number] Arrival date & time: 01/25/18  2318     History   Chief Complaint Chief Complaint  Patient presents with  . Drug Overdose    HPI Jeffrey Schultz is a 37 y.o. male.  Patient brought to the emergency department by EMS after accidental opiate overdose.  Patient was found lying in a parking lot.  He was unresponsive.  EMS administered the patient 0.5 mg of Narcan upon which time the patient woke up.  He admits that he was "partying".  He admits to injecting heroin earlier.  He was not trying to hurt himself.  Patient is currently without complaints.     Past Medical History:  Diagnosis Date  . Anxiety   . Depression   . Drug overdose   . Substance abuse [facility])     Patient Active Problem List   Diagnosis Date Noted  . Respiratory failure (HCC) 12/30/2015  . Drug overdose 12/30/2015  . Acute respiratory failure with hypoxia (HCC) 12/30/2015  . Acute encephalopathy 12/30/2015  . Tobacco use disorder 12/30/2015  . Hypokalemia 12/30/2015  . Overdose 12/30/2015    History reviewed. No pertinent surgical history.      Home Medications    Prior to Admission medications   Medication Sig Start Date End Date Taking? Authorizing Provider  albuterol (PROVENTIL HFA;VENTOLIN HFA) 108 (90 Base) MCG/ACT inhaler Inhale 1-2 puffs into the lungs every 6 (six) hours as needed for wheezing or shortness of breath. 01/18/17   Khatri, Hina, PA-C  benzonatate (TESSALON) 100 MG capsule Take 1 capsule (100 mg total) by mouth every 8 (eight) hours. 01/18/17   Khatri, Hina, PA-C  ibuprofen (ADVIL,MOTRIN) 200 MG tablet Take 600 mg by mouth every 6 (six) hours as needed for moderate pain.    [provider]    Family History No family history on file.  Social History Social History   Tobacco Use  . Smoking status: Current Every Day Smoker    Packs/day: 1.00  . Smokeless tobacco: Former Engineer, water Use Topics    . Alcohol use: No  . Drug use: Yes    Types: Heroin, Benzodiazepines, Marijuana, IV    Comment: heroin     Allergies   Patient has no known allergies.   Review of Systems Review of Systems  Respiratory: Negative for shortness of breath.   Cardiovascular: Negative for chest pain.  Musculoskeletal: Negative for neck pain.  Neurological: Negative for headaches.  All other systems reviewed and are negative.    Physical Exam Updated Vital Signs BP 105/71   Pulse 63   Temp 97.9 F (36.6 C) (Oral)   Resp 11   Ht 5\' 8"  (1.727 m)   Wt 64.4 kg (142 lb)   SpO2 96%   BMI 21.59 kg/m   Physical Exam  Constitutional: He is oriented to person, place, and time. He appears well-developed and well-nourished. No distress.  HENT:  Head: Normocephalic and atraumatic.  Right Ear: Hearing normal.  Left Ear: Hearing normal.  Nose: Nose normal.  Mouth/Throat: Oropharynx is clear and moist and mucous membranes are normal.  Eyes: Pupils are equal, round, and reactive to light. Conjunctivae and EOM are normal.  Neck: Normal range of motion. Neck supple.  Cardiovascular: Regular rhythm, S1 normal and S2 normal. Exam reveals no gallop and no friction rub.  No murmur heard. Pulmonary/Chest: Effort normal and breath sounds normal. No respiratory distress. He exhibits no tenderness.  Abdominal: Soft.  Normal appearance and bowel sounds are normal. There is no hepatosplenomegaly. There is no tenderness. There is no rebound, no guarding, no tenderness at McBurney's point and negative Murphy's sign. No hernia.  Musculoskeletal: Normal range of motion.  Neurological: He is alert and oriented to person, place, and time. He has normal strength. No cranial nerve deficit or sensory deficit. Coordination normal. GCS eye subscore is 4. GCS verbal subscore is 5. GCS motor subscore is 6.  Skin: Skin is warm, dry and intact. No rash noted. No cyanosis.  Psychiatric: He has a normal mood and affect. His speech  is normal and behavior is normal. Thought content normal.  Nursing note and vitals reviewed.    ED Treatments / Results  Labs (all labs ordered are listed, but only abnormal results are displayed) Labs Reviewed - No data to display  EKG None  Radiology No results found.  Procedures Procedures (including critical care time)  Medications Ordered in ED Medications - No data to display   Initial Impression / Assessment and Plan / ED Course  I have reviewed the triage vital signs and the nursing notes.  Pertinent labs & imaging results that were available during my care of the patient were reviewed by me and considered in my medical decision making (see chart for details).     Patient found unresponsive in a parking lot and EMS administered Narcan with reversal.  At arrival to the ER patient was awake, alert and oriented.  He admits to using heroin while "partying".  No intent to harm himself.  Patient monitored.  He has been sleepy and somnolent at times but did not require any further Narcan.  Now awake and alert, will discharge.  Final Clinical Impressions(s) / ED Diagnoses   Final diagnoses:  Accidental overdose of heroin, initial encounter [facility](HCC)    ED Discharge Orders    None       Gilda CreasePollina, Deante Blough J, MD 01/26/18 [phone]

## 2018-01-25 NOTE — ED Notes (Signed)
Pt requested rehabilitation resources

## 2018-01-25 NOTE — ED Triage Notes (Signed)
Pt was found unresponsive in a parking lot by bystanders who called ems.  Ems gave 0.5 mg narcan and pt awoke and has remained awake and alert.  Pt denies pain although it is unknown if pt may have fallen.  Pt has c-collar in place due to same.   Pt denies attempt at self harm tonight.

## 2018-01-26 MED ORDER — NALOXONE HCL 4 MG/0.1ML NA LIQD
1.0000 | Freq: Once | NASAL | Status: AC
Start: 2018-01-26 — End: 2018-01-26
  Administered 2018-01-26: 1 via NASAL
  Filled 2018-01-26: qty 4

## 2018-01-26 NOTE — ED Notes (Signed)
Patient denies pain and is resting comfortably.  

## 2018-05-12 ENCOUNTER — Other Ambulatory Visit: Payer: Self-pay

## 2018-05-12 ENCOUNTER — Encounter (HOSPITAL_COMMUNITY): Payer: Self-pay | Admitting: Emergency Medicine

## 2018-05-12 ENCOUNTER — Inpatient Hospital Stay (HOSPITAL_COMMUNITY): Payer: Self-pay

## 2018-05-12 ENCOUNTER — Inpatient Hospital Stay (HOSPITAL_COMMUNITY)
Admission: EM | Admit: 2018-05-12 | Discharge: 2018-05-15 | DRG: 580 | Disposition: A | Discharge status: Home / Self Care | Payer: Self-pay | Attending: Internal Medicine | Admitting: Internal Medicine

## 2018-05-12 DIAGNOSIS — L03114 Cellulitis of left upper limb: Secondary | ICD-10-CM | POA: Diagnosis present

## 2018-05-12 DIAGNOSIS — F1120 Opioid dependence, uncomplicated: Secondary | ICD-10-CM | POA: Diagnosis present

## 2018-05-12 DIAGNOSIS — L0291 Cutaneous abscess, unspecified: Secondary | ICD-10-CM

## 2018-05-12 DIAGNOSIS — F419 Anxiety disorder, unspecified: Secondary | ICD-10-CM | POA: Diagnosis present

## 2018-05-12 DIAGNOSIS — X58XXXA Exposure to other specified factors, initial encounter: Secondary | ICD-10-CM | POA: Diagnosis present

## 2018-05-12 DIAGNOSIS — L02414 Cutaneous abscess of left upper limb: Principal | ICD-10-CM | POA: Diagnosis present

## 2018-05-12 DIAGNOSIS — Z791 Long term (current) use of non-steroidal anti-inflammatories (NSAID): Secondary | ICD-10-CM

## 2018-05-12 DIAGNOSIS — S51802A Unspecified open wound of left forearm, initial encounter: Secondary | ICD-10-CM | POA: Diagnosis present

## 2018-05-12 DIAGNOSIS — F17200 Nicotine dependence, unspecified, uncomplicated: Secondary | ICD-10-CM

## 2018-05-12 DIAGNOSIS — Z79899 Other long term (current) drug therapy: Secondary | ICD-10-CM

## 2018-05-12 DIAGNOSIS — F329 Major depressive disorder, single episode, unspecified: Secondary | ICD-10-CM | POA: Diagnosis present

## 2018-05-12 DIAGNOSIS — F17210 Nicotine dependence, cigarettes, uncomplicated: Secondary | ICD-10-CM | POA: Diagnosis present

## 2018-05-12 LAB — CBC WITH DIFFERENTIAL/PLATELET
Abs Immature Granulocytes: 0.09 10*3/uL — ABNORMAL HIGH (ref 0.00–0.07)
Basophils Absolute: 0.1 10*3/uL (ref 0.0–0.1)
Basophils Relative: 0 %
EOS ABS: 0 10*3/uL (ref 0.0–0.5)
EOS PCT: 0 %
HEMATOCRIT: 42 % (ref 39.0–52.0)
Hemoglobin: 13.4 g/dL (ref 13.0–17.0)
IMMATURE GRANULOCYTES: 0 %
LYMPHS ABS: 1.5 10*3/uL (ref 0.7–4.0)
Lymphocytes Relative: 7 %
MCH: 27.3 pg (ref 26.0–34.0)
MCHC: 31.9 g/dL (ref 30.0–36.0)
MCV: 85.5 fL (ref 80.0–100.0)
MONO ABS: 1 10*3/uL (ref 0.1–1.0)
MONOS PCT: 5 %
Neutro Abs: 18.8 10*3/uL — ABNORMAL HIGH (ref 1.7–7.7)
Neutrophils Relative %: 88 %
Platelets: 357 10*3/uL (ref 150–400)
RBC: 4.91 MIL/uL (ref 4.22–5.81)
RDW: 14.6 % (ref 11.5–15.5)
WBC: 21.5 10*3/uL — ABNORMAL HIGH (ref 4.0–10.5)
nRBC: 0 % (ref 0.0–0.2)

## 2018-05-12 LAB — RAPID URINE DRUG SCREEN, HOSP PERFORMED
Amphetamines: NOT DETECTED
BARBITURATES: NOT DETECTED
BENZODIAZEPINES: NOT DETECTED
COCAINE: NOT DETECTED
OPIATES: NOT DETECTED
Tetrahydrocannabinol: NOT DETECTED

## 2018-05-12 LAB — COMPREHENSIVE METABOLIC PANEL
ALBUMIN: 3.5 g/dL (ref 3.5–5.0)
ALK PHOS: 63 U/L (ref 38–126)
ALT: 37 U/L (ref 0–44)
AST: 23 U/L (ref 15–41)
Anion gap: 10 (ref 5–15)
BUN: 9 mg/dL (ref 6–20)
CALCIUM: 9 mg/dL (ref 8.9–10.3)
CO2: 25 mmol/L (ref 22–32)
CREATININE: 0.53 mg/dL — AB (ref 0.61–1.24)
Chloride: 100 mmol/L (ref 98–111)
GFR calc Af Amer: >60 mL/min (ref >60)
GFR calc non Af Amer: >60 mL/min (ref >60)
GLUCOSE: 109 mg/dL — AB (ref 70–99)
Potassium: 3.9 mmol/L (ref 3.5–5.1)
Sodium: 135 mmol/L (ref 135–145)
Total Bilirubin: 1.1 mg/dL (ref 0.3–1.2)
Total Protein: 7.6 g/dL (ref 6.5–8.1)

## 2018-05-12 LAB — SURGICAL PCR SCREEN
MRSA, PCR: NEGATIVE
Staphylococcus aureus: POSITIVE — AB

## 2018-05-12 MED ORDER — NICOTINE 21 MG/24HR TD PT24
21.0000 mg | MEDICATED_PATCH | Freq: Every day | TRANSDERMAL | Status: DC
  Administered 2018-05-12 – 2018-05-14 (×3): 21 mg via TRANSDERMAL
  Filled 2018-05-12 (×2): qty 1

## 2018-05-12 MED ORDER — SODIUM CHLORIDE 0.9 % IV BOLUS (SEPSIS)
1000.0000 mL | Freq: Once | INTRAVENOUS | Status: AC
  Administered 2018-05-12: 1000 mL via INTRAVENOUS

## 2018-05-12 MED ORDER — SODIUM CHLORIDE 0.9 % IV SOLN
1000.0000 mL | INTRAVENOUS | Status: DC
Start: 2018-05-12 — End: 2018-05-13
  Administered 2018-05-12: 1000 mL via INTRAVENOUS

## 2018-05-12 MED ORDER — ONDANSETRON HCL 4 MG/2ML IJ SOLN
4.0000 mg | Freq: Four times a day (QID) | INTRAMUSCULAR | Status: DC | PRN
  Administered 2018-05-13: 4 mg via INTRAVENOUS

## 2018-05-12 MED ORDER — NICOTINE 21 MG/24HR TD PT24: 21.0000 mg | MEDICATED_PATCH | Freq: Every day | TRANSDERMAL | Status: DC

## 2018-05-12 MED ORDER — ACETAMINOPHEN 325 MG PO TABS
650.0000 mg | ORAL_TABLET | Freq: Four times a day (QID) | ORAL | Status: DC | PRN
  Administered 2018-05-13 (×2): 650 mg via ORAL
  Filled 2018-05-12 (×2): qty 2

## 2018-05-12 MED ORDER — ACETAMINOPHEN 650 MG RE SUPP: 650.0000 mg | Freq: Four times a day (QID) | RECTAL | Status: DC | PRN

## 2018-05-12 MED ORDER — HYDROCODONE-ACETAMINOPHEN 5-325 MG PO TABS
2.0000 | ORAL_TABLET | Freq: Once | ORAL | Status: AC
  Administered 2018-05-12: 2 via ORAL
  Filled 2018-05-12: qty 2

## 2018-05-12 MED ORDER — CEFAZOLIN SODIUM-DEXTROSE 1-4 GM/50ML-% IV SOLN
1.0000 g | Freq: Once | INTRAVENOUS | Status: AC
  Administered 2018-05-12: 1 g via INTRAVENOUS
  Filled 2018-05-12: qty 50

## 2018-05-12 MED ORDER — VANCOMYCIN HCL IN DEXTROSE 1-5 GM/200ML-% IV SOLN
1000.0000 mg | Freq: Once | INTRAVENOUS | Status: AC
  Administered 2018-05-12: 1000 mg via INTRAVENOUS
  Filled 2018-05-12: qty 200

## 2018-05-12 MED ORDER — SODIUM CHLORIDE 0.9 % IV SOLN
INTRAVENOUS | Status: DC
  Administered 2018-05-12 (×2): via INTRAVENOUS

## 2018-05-12 MED ORDER — MORPHINE SULFATE (PF) 2 MG/ML IV SOLN
2.0000 mg | INTRAVENOUS | Status: DC | PRN
  Administered 2018-05-12 – 2018-05-15 (×15): 2 mg via INTRAVENOUS
  Filled 2018-05-12 (×15): qty 1

## 2018-05-12 MED ORDER — ONDANSETRON HCL 4 MG PO TABS: 4.0000 mg | ORAL_TABLET | Freq: Four times a day (QID) | ORAL | Status: DC | PRN

## 2018-05-12 MED ORDER — NICOTINE 21 MG/24HR TD PT24
MEDICATED_PATCH | TRANSDERMAL | Status: AC
  Administered 2018-05-12: 21 mg via TRANSDERMAL
  Filled 2018-05-12: qty 1

## 2018-05-12 MED ORDER — HEPARIN SODIUM (PORCINE) 5000 UNIT/ML IJ SOLN
5000.0000 [IU] | Freq: Three times a day (TID) | INTRAMUSCULAR | Status: DC
  Administered 2018-05-12 – 2018-05-15 (×7): 5000 [IU] via SUBCUTANEOUS
  Filled 2018-05-12 (×8): qty 1

## 2018-05-12 MED ORDER — IBUPROFEN 800 MG PO TABS
800.0000 mg | ORAL_TABLET | Freq: Once | ORAL | Status: AC
  Administered 2018-05-12: 800 mg via ORAL
  Filled 2018-05-12: qty 1

## 2018-05-12 NOTE — Consult Note (Signed)
Reason for Consult: Left forearm and antecubital fossa cellulitis and possible abscess. Referring Physician: Dr. Heywood Schultz is an 37 y.o. male.  HPI: Patient states earlier in the week around Monday he began noticing some pain and discomfort in his left elbow.  This was at work.  He was able to continue with his job however the next day he noted even more pain.  He developed some redness and swelling about his forearm and elbow.  Then a couple of days ago part of the wound came to ahead and there was a subcutaneous area of purulence.  Yesterday this popped and purulent material was draining.  He was seen at an outside hospital where there is concern for underlying deep antecubital fossa abscess and was sent to [facility] for definitive treatment and evaluation.  Orthopedics was consulted to evaluate given the possibility of abscess and need for surgical debridement.  Patient states his pain is somewhat improved since he got a dose of antibiotics earlier today.  He is noted some improvement in the redness.  He is also noted that the wound has continued to drain but has slowed down somewhat.  He denies any fevers, chills.  He denies any other joint or extremity pain today.  He denies any IV drug abuse.  He thinks he might have cut his arm while at work.  He works as a Engineer, civil (consulting).  Past Medical History:  Diagnosis Date  . Anxiety   . Depression   . Drug overdose   . Substance abuse ([place])     History reviewed. No pertinent surgical history.  History reviewed. No pertinent family history.  Social History:  reports that he has been smoking. He has been smoking about 1.00 pack per day. He has never used smokeless tobacco. He reports that he has current or past drug history. Drugs: Heroin, Benzodiazepines, Marijuana, and IV. He reports that he does not drink alcohol.  Allergies: No Known Allergies  Medications: I have reviewed the patient's current medications.  Results for  orders placed or performed during the hospital encounter of 05/12/18 (from the past 48 hour(s))  Rapid urine drug screen (hospital performed)     Status: None   Collection Time: 05/12/18 11:21 AM  Result Value Ref Range   Opiates NONE DETECTED NONE DETECTED   Cocaine NONE DETECTED NONE DETECTED   Benzodiazepines NONE DETECTED NONE DETECTED   Amphetamines NONE DETECTED NONE DETECTED   Tetrahydrocannabinol NONE DETECTED NONE DETECTED   Barbiturates NONE DETECTED NONE DETECTED    Comment: (NOTE) DRUG SCREEN FOR MEDICAL PURPOSES ONLY.  IF CONFIRMATION IS NEEDED FOR ANY PURPOSE, NOTIFY LAB WITHIN 5 DAYS. LOWEST DETECTABLE LIMITS FOR URINE DRUG SCREEN Drug Class                     Cutoff (ng/mL) Amphetamine and metabolites    1000 Barbiturate and metabolites    200 Benzodiazepine                 592 Tricyclics and metabolites     300 Opiates and metabolites        300 Cocaine and metabolites        300 THC                            50 Performed at [facility], [address], [place], [place] [address]   CBC with Differential/Platelet  Status: Abnormal   Collection Time: 05/12/18 11:26 AM  Result Value Ref Range   WBC 21.5 (H) 4.0 - 10.5 K/uL   RBC 4.91 4.22 - 5.81 MIL/uL   Hemoglobin 13.4 13.0 - 17.0 g/dL   HCT 42.0 39.0 - 52.0 %   MCV 85.5 80.0 - 100.0 fL   MCH 27.3 26.0 - 34.0 pg   MCHC 31.9 30.0 - 36.0 g/dL   RDW 14.6 11.5 - 15.5 %   Platelets 357 150 - 400 K/uL   nRBC 0.0 0.0 - 0.2 %   Neutrophils Relative % 88 %   Neutro Abs 18.8 (H) 1.7 - 7.7 K/uL   Lymphocytes Relative 7 %   Lymphs Abs 1.5 0.7 - 4.0 K/uL   Monocytes Relative 5 %   Monocytes Absolute 1.0 0.1 - 1.0 K/uL   Eosinophils Relative 0 %   Eosinophils Absolute 0.0 0.0 - 0.5 K/uL   Basophils Relative 0 %   Basophils Absolute 0.1 0.0 - 0.1 K/uL   Immature Granulocytes 0 %   Abs Immature Granulocytes 0.09 (H) 0.00 - 0.07 K/uL    Comment: Performed at [facility], [address], [place], [place]  [address]  Comprehensive metabolic panel     Status: Abnormal   Collection Time: 05/12/18 11:26 AM  Result Value Ref Range   Sodium 135 135 - 145 mmol/L   Potassium 3.9 3.5 - 5.1 mmol/L   Chloride 100 98 - 111 mmol/L   CO2 25 22 - 32 mmol/L   Glucose, Bld 109 (H) 70 - 99 mg/dL   BUN 9 6 - 20 mg/dL   Creatinine, Ser 0.53 (L) 0.61 - 1.24 mg/dL   Calcium 9.0 8.9 - 10.3 mg/dL   Total Protein 7.6 6.5 - 8.1 g/dL   Albumin 3.5 3.5 - 5.0 g/dL   AST 23 15 - 41 U/L   ALT 37 0 - 44 U/L   Alkaline Phosphatase 63 38 - 126 U/L   Total Bilirubin 1.1 0.3 - 1.2 mg/dL   GFR calc non Af Amer >60 >60 mL/min   GFR calc Af Amer >60 >60 mL/min    Comment: (NOTE) The eGFR has been calculated using the CKD EPI equation. This calculation has not been validated in all clinical situations. eGFR's persistently <60 mL/min signify possible Chronic Kidney Disease.    Anion gap 10 5 - 15    Comment: Performed at [facility], [address] [place], [place] [address]  Culture, blood (Routine X 2) w Reflex to ID Panel     Status: None (Preliminary result)   Collection Time: 05/12/18 11:26 AM  Result Value Ref Range   Specimen Description BLOOD RIGHT HAND    Special Requests      BOTTLES DRAWN AEROBIC AND ANAEROBIC Blood Culture adequate volume Performed at [facility], [address], [place], [place] [address]    Culture PENDING    Report Status PENDING   Culture, blood (Routine X 2) w Reflex to ID Panel     Status: None (Preliminary result)   Collection Time: 05/12/18 11:37 AM  Result Value Ref Range   Specimen Description BLOOD LEFT HAND    Special Requests      BOTTLES DRAWN AEROBIC AND ANAEROBIC Blood Culture adequate volume Performed at [facility], [address], [place], [place] [address]    Culture PENDING    Report Status PENDING     No results found.  Review of Systems  Constitutional: Negative.   HENT: Negative.  Respiratory: Negative.   Cardiovascular: Negative.   Gastrointestinal:  Negative.   Musculoskeletal:       Pain in left forearm and elbow.  Skin:       Left forearm open wound with purulent drainage  Neurological: Negative.   Psychiatric/Behavioral: Negative.    Blood pressure 120/69, pulse (!) 112, temperature 98.6 F (37 C), temperature source Oral, resp. rate 16, height '5\' 7"'  (1.702 m), weight 64.4 kg, SpO2 99 %. Physical Exam  Constitutional: He is oriented to person, place, and time. He appears well-developed.  HENT:  Head: Normocephalic.  Eyes: Conjunctivae are normal.  Neck: Neck supple.  Cardiovascular: Normal rate.  Respiratory: Effort normal.  Musculoskeletal:  There is a 1 cm round ulceration with purulent drainage on the dorsal radial forearm just distal to the radial head region.  Unable to express purulence.  There is some medial induration and erythema extending up beyond the elbow to the medial arm.  He is able to range his elbow without much difficulty but it is stiff.  He has intact motor and sensation distally.  No area of fluctuance on the medial arm or on the posterior aspect of the elbow.  Palpable radial pulse.  Neurological: He is alert and oriented to person, place, and time.  Psychiatric: He has a normal mood and affect. His behavior is normal.    Assessment/Plan: Left superficial abscess with draining wound and cellulitis about the left elbow and medial arm.  Given the location of the draining wound we will order an MRI scan to look for an underlying deep abscess.  The wound is draining which is encouraging.  I have low suspicion for septic elbow given that he can move his elbow without significant pain.  I offered a bedside I&D of the superficial abscess but the patient refused.  Depending on the MRI results he may require formal surgical debridement in the operating room so we will keep him n.p.o. for now.  This would likely be tomorrow morning.  He is certainly not septic from this and I feel it safe to wait for the MRI  scan.  For now we will defer to medical team for empiric antibiotic treatment.  Will update treatment plan with MRI results.   Erle Crocker 05/12/2018, 5:12 PM

## 2018-05-12 NOTE — Progress Notes (Signed)
LEFT ARM    DTat,DO

## 2018-05-12 NOTE — ED Provider Notes (Addendum)
[facility] EMERGENCY DEPARTMENT Provider Note   CSN: [id number] Arrival date & time: 05/12/18  2130     History   Chief Complaint Chief Complaint  Patient presents with  . Abscess    HPI Jeffrey Schultz is a 37 y.o. male.  The history is provided by the patient. No language interpreter was used.  Abscess  Location:  Shoulder/arm Shoulder/arm abscess location:  L arm Size:  12 Abscess quality: induration, redness and warmth   Red streaking: no   Progression:  Worsening Chronicity:  New Context: not diabetes   Relieved by:  Nothing Worsened by:  Nothing Ineffective treatments:  None tried Associated symptoms: no nausea   Pt complains of a swelling to his left arm.  Pt reports area came to a head and started draining but now swelling is going to his shoulder.  Pt reports pain with moving elbow  Past Medical History:  Diagnosis Date  . Anxiety   . Depression   . Drug overdose   . Substance abuse [facility])     Patient Active Problem List   Diagnosis Date Noted  . Respiratory failure (HCC) 12/30/2015  . Drug overdose 12/30/2015  . Acute respiratory failure with hypoxia (HCC) 12/30/2015  . Acute encephalopathy 12/30/2015  . Tobacco use disorder 12/30/2015  . Hypokalemia 12/30/2015  . Overdose 12/30/2015    History reviewed. No pertinent surgical history.      Home Medications    Prior to Admission medications   Medication Sig Start Date End Date Taking? Authorizing Provider  albuterol (PROVENTIL HFA;VENTOLIN HFA) 108 (90 Base) MCG/ACT inhaler Inhale 1-2 puffs into the lungs every 6 (six) hours as needed for wheezing or shortness of breath. 01/18/17   Khatri, Hina, PA-C  benzonatate (TESSALON) 100 MG capsule Take 1 capsule (100 mg total) by mouth every 8 (eight) hours. 01/18/17   Khatri, Hina, PA-C  ibuprofen (ADVIL,MOTRIN) 200 MG tablet Take 600 mg by mouth every 6 (six) hours as needed for moderate pain.    [provider]    Family History History  reviewed. No pertinent family history.  Social History Social History   Tobacco Use  . Smoking status: Current Every Day Smoker    Packs/day: 1.00  . Smokeless tobacco: Never Used  Substance Use Topics  . Alcohol use: No  . Drug use: Yes    Types: Heroin, Benzodiazepines, Marijuana, IV    Comment: heroin     Allergies   Patient has no known allergies.   Review of Systems Review of Systems  Gastrointestinal: Negative for nausea.  Skin: Positive for wound.  All other systems reviewed and are negative.    Physical Exam Updated Vital Signs BP (!) 133/95 (BP Location: Right Arm)   Pulse (S) (!) 122   Temp 98.5 F (36.9 C) (Oral)   Resp 16   Ht 5\' 7"  (1.702 m)   Wt 64.4 kg   SpO2 100%   BMI 22.24 kg/m   Physical Exam  Constitutional: He appears well-developed and well-nourished.  HENT:  Head: Normocephalic and atraumatic.  Eyes: Conjunctivae are normal.  Neck: Neck supple.  Cardiovascular: Normal rate and regular rhythm.  No murmur heard. Pulmonary/Chest: Effort normal and breath sounds normal. No respiratory distress.  Abdominal: Soft. There is no tenderness.  Musculoskeletal: He exhibits tenderness. He exhibits no edema.  12 cm red swollen area left antecubital area,  Redness and swelling to shoulder   Neurological: He is alert.  Skin: Skin is warm and dry.  Psychiatric: He has a normal mood and affect.  Nursing note and vitals reviewed.    ED Treatments / Results  Labs (all labs ordered are listed, but only abnormal results are displayed) Labs Reviewed  CULTURE, BLOOD (ROUTINE X 2)  CULTURE, BLOOD (ROUTINE X 2)  CBC WITH DIFFERENTIAL/PLATELET  COMPREHENSIVE METABOLIC PANEL    EKG None  Radiology No results found.  Procedures Procedures (including critical care time)  Medications Ordered in ED Medications  sodium chloride 0.9 % bolus 1,000 mL (has no administration in time range)    Followed by  sodium chloride 0.9 % bolus 1,000 mL (has  no administration in time range)    Followed by  0.9 %  sodium chloride infusion (has no administration in time range)  ceFAZolin (ANCEF) IVPB 1 g/50 mL premix (has no administration in time range)     Initial Impression / Assessment and Plan / ED Course  I have reviewed the triage vital signs and the nursing notes.  Pertinent labs & imaging results that were available during my care of the patient were reviewed by me and considered in my medical decision making (see chart for details).     MDM  Labs ordered.  Iv antibiotics stopped   Final Clinical Impressions(s) / ED Diagnoses   Final diagnoses:  Cellulitis of left arm  Abscess of left arm    ED Discharge Orders    None     Hospitalist consulted to admit   Osie Cheeks 05/12/18 1242    Elson Areas, PA-C 05/12/18 1254    Donnetta Hutching, MD 05/12/18 1452

## 2018-05-12 NOTE — Progress Notes (Addendum)
Pharmacy Note:  Pharmacy consulted to dose vancomycin in this 37 yom with left arm infection.  Plan:  Give vancomycin 1g IV x1 dose now, then vancomycin 1g IV q12h  Goal vancomycin trough range:  10-15  mcg/mL  Pharmacy will continue to monitor renal function, vancomycin troughs as clinically indicated,  cultures and patient progress.   Estimated Creatinine Clearance: 117.4 mL/min (A) (by C-G formula based on SCr of 0.53 mg/dL (L)).   No Known Allergies  Vitals:   05/12/18 1230 05/12/18 1338  BP: 126/87 131/73  Pulse: (!) 114 (!) 114  Resp:    Temp:    SpO2: 99% 99%    Anti-infectives (From admission, onward)   Start     Dose/Rate Route Frequency Ordered Stop   05/12/18 1500  vancomycin (VANCOCIN) IVPB 1000 mg/200 mL premix     1,000 mg 200 mL/hr over 60 Minutes Intravenous  Once 05/12/18 1356     05/12/18 1115  ceFAZolin (ANCEF) IVPB 1 g/50 mL premix     1 g 100 mL/hr over 30 Minutes Intravenous  Once 05/12/18 1114 05/12/18 1232     Antimicrobials this admission:  cefazolin 10/12 >> 10/12 vancomycin 10/12 >>    Microbiology results:  10/12 BCx2:  In progress  [place] High, [facility] 05/12/2018 1:59 PM

## 2018-05-12 NOTE — Plan of Care (Signed)

## 2018-05-12 NOTE — ED Triage Notes (Signed)
Patient c/o abscess to left AC that appeared on Monday. Per patient using warm cloth compress and "home remedy made by aunt" that brought abscess to a "head." Patient reports serosanguinous drainage. Denies any known fever. Patient reports taking ibuprofen at 11:30pm last night with no relief.

## 2018-05-12 NOTE — H&P (Signed)
History and Physical  Jeffrey Schultz:[id number] DOB: May 28, 1981 DOA: 05/12/2018   PCP: Patient, No Pcp Per   Patient coming from: Home  Chief Complaint: left arm infection  HPI:  Jeffrey Schultz is a 37 y.o. male with medical history of opioid and tobacco abuse presenting with erythema and drainage from his left upper extremity around the antecubital fossa.  The patient noted an area of erythema and pain that started on 05/07/2018 in the left antecubital fossa area.  Over a period of 48 hours, he noted that the area became blisterlike.  He put on some over-the-counter ointment and on 05/11/2018, the blister ruptured draining purulent material.  Since that time, he has noted increasing erythema, pain, and induration spreading proximally up to his bicep and tricep area.  As result, the patient presented for further evaluation.  The patient denies any illegal drug use or injection in this area of the abscess.  He states that he has only been using ibuprofen.  He last used opioids "off the street" approximately 2 weeks prior to this admission.  He denies any injection drug use.  He denies any other illicit drug use.  He smokes 1 pack a day.  He denies any fevers, chills, chest pain, nausea, vomiting, diarrhea, abdominal pain, dysuria, hematuria, hematochezia, melena. In the emergency department, the patient was afebrile hemodynamically stable saturating 100% room air.  The patient was started on cefazolin.  WBC was 21.5.  BMP and LFTs were unremarkable.  Assessment/Plan: Left upper extremity abscess -Start vancomycin IV -Notified ortho surgery, Dr. Susa Simmonds, of transfer to [facility] and need for possible debridement -judicious pain control -follow blood cultures -MRI left upper extremity  Tobacco abuse -nicoderm patch I have discussed tobacco cessation with the patient.  I have counseled the patient regarding the negative impacts of continued tobacco use including but not limited to lung  cancer, COPD, and cardiovascular disease.  I have discussed alternatives to tobacco and modalities that may help facilitate tobacco cessation including but not limited to biofeedback, hypnosis, and medications.  Total time spent with tobacco counseling was 4 minutes.  Opioid abuse -last used 2 weeks prior to admission -denies IVDU -UDS neg        Past Medical History:  Diagnosis Date  . Anxiety   . Depression   . Drug overdose   . Substance abuse (HCC)    History reviewed. No pertinent surgical history. Social History:  reports that he has been smoking. He has been smoking about 1.00 pack per day. He has never used smokeless tobacco. He reports that he has current or past drug history. Drugs: Heroin, Benzodiazepines, Marijuana, and IV. He reports that he does not drink alcohol.   History reviewed. No pertinent family history.   No Known Allergies   Prior to Admission medications   Medication Sig Start Date End Date Taking? Authorizing Provider  albuterol (PROVENTIL HFA;VENTOLIN HFA) 108 (90 Base) MCG/ACT inhaler Inhale 1-2 puffs into the lungs every 6 (six) hours as needed for wheezing or shortness of breath. 01/18/17   Khatri, Hina, PA-C  ibuprofen (ADVIL,MOTRIN) 200 MG tablet Take 600 mg by mouth every 6 (six) hours as needed for moderate pain.    [provider]    Review of Systems:  Constitutional:  No weight loss, night sweats, Fevers, chills, fatigue.  Head&Eyes: No headache.  No vision loss.  No eye pain or scotoma ENT:  No Difficulty swallowing,Tooth/dental problems,Sore throat,  No ear ache,  post nasal drip,  Cardio-vascular:  No chest pain, Orthopnea, PND, swelling in lower extremities,  dizziness, palpitations  GI:  No  abdominal pain, nausea, vomiting, diarrhea, loss of appetite, hematochezia, melena, heartburn, indigestion, Resp:  No shortness of breath with exertion or at rest. No cough. No coughing up of blood .No wheezing.No chest wall  deformity  Skin:  no rash or lesions.  GU:  no dysuria, change in color of urine, no urgency or frequency. No flank pain.  Musculoskeletal:  Left forearm abscess with pain and drainage and swelling.  No back pain.  Psych:  No change in mood or affect. No depression or anxiety. Neurologic: No headache, no dysesthesia, no focal weakness, no vision loss. No syncope  Physical Exam: Vitals:   05/12/18 0955 05/12/18 0956  BP: (!) 133/95   Pulse: (S) (!) 122   Resp: 16   Temp: 98.5 F (36.9 C)   TempSrc: Oral   SpO2: 100%   Weight:  64.4 kg  Height:  5\' 7"  (1.702 m)   General:  A&O x 3, NAD, nontoxic, pleasant/cooperative Head/Eye: No conjunctival hemorrhage, no icterus, Cale/AT, No nystagmus ENT:  No icterus,  No thrush, good dentition, no pharyngeal exudate Neck:  No masses, no lymphadenpathy, no bruits CV:  RRR, no rub, no gallop, no S3 Lung:  CTAB, good air movement, no wheeze, no rhonchi Abdomen: soft/NT, +BS, nondistended, no peritoneal signs Ext: LUE cap refill < 2 sec.  Radial and ulnar pulses intact.  See picture below of left arm Neuro: CNII-XII intact, strength 4/5 in bilateral upper and lower extremities, no dysmetria       Labs on Admission:  Basic Metabolic Panel: Recent Labs  Lab 05/12/18 1126  NA 135  K 3.9  CL 100  CO2 25  GLUCOSE 109*  BUN 9  CREATININE 0.53*  CALCIUM 9.0   Liver Function Tests: Recent Labs  Lab 05/12/18 1126  AST 23  ALT 37  ALKPHOS 63  BILITOT 1.1  PROT 7.6  ALBUMIN 3.5   No results for input(s): LIPASE, AMYLASE in the last 168 hours. No results for input(s): AMMONIA in the last 168 hours. CBC: Recent Labs  Lab 05/12/18 1126  WBC 21.5*  NEUTROABS 18.8*  HGB 13.4  HCT 42.0  MCV 85.5  PLT 357   Coagulation Profile: No results for input(s): INR, PROTIME in the last 168 hours. Cardiac Enzymes: No results for input(s): CKTOTAL, CKMB, CKMBINDEX, TROPONINI in the last 168 hours. BNP: Invalid input(s):  POCBNP CBG: No results for input(s): GLUCAP in the last 168 hours. Urine analysis:    Component Value Date/Time   COLORURINE STRAW (A) 12/30/2015 0413   APPEARANCEUR CLEAR 12/30/2015 0413   LABSPEC <1.005 (L) 12/30/2015 0413   PHURINE 6.0 12/30/2015 0413   GLUCOSEU NEGATIVE 12/30/2015 0413   HGBUR NEGATIVE 12/30/2015 0413   BILIRUBINUR NEGATIVE 12/30/2015 0413   KETONESUR NEGATIVE 12/30/2015 0413   PROTEINUR NEGATIVE 12/30/2015 0413   NITRITE NEGATIVE 12/30/2015 0413   LEUKOCYTESUR NEGATIVE 12/30/2015 0413   Sepsis Labs: @LABRCNTIP (procalcitonin:4,lacticidven:4) ) Recent Results (from the past 240 hour(s))  Culture, blood (Routine X 2) w Reflex to ID Panel     Status: None (Preliminary result)   Collection Time: 05/12/18 11:26 AM  Result Value Ref Range Status   Specimen Description BLOOD RIGHT HAND  Final   Special Requests   Final    BOTTLES DRAWN AEROBIC AND ANAEROBIC Blood Culture adequate volume Performed at [facility], [address], [place], [place]  [address]    Culture PENDING  Incomplete   Report Status PENDING  Incomplete  Culture, blood (Routine X 2) w Reflex to ID Panel     Status: None (Preliminary result)   Collection Time: 05/12/18 11:37 AM  Result Value Ref Range Status   Specimen Description BLOOD LEFT HAND  Final   Special Requests   Final    BOTTLES DRAWN AEROBIC AND ANAEROBIC Blood Culture adequate volume Performed at [facility], [address] [place], [place] [address]    Culture PENDING  Incomplete   Report Status PENDING  Incomplete     Radiological Exams on Admission: No results found.     Time spent:60 minutes Code Status:   FULL Family Communication:  No Family at bedside Disposition Plan: expect 1-2 day hospitalization Consults called: [facility]--Dr. Susa Simmonds  DVT Prophylaxis: [place] Heparin   Catarina Hartshorn, DO  [facility] Pager [phone]  If 7PM-7AM, please contact night-coverage [email or web address] Password  [email or web address] 05/12/2018, 1:30 PM

## 2018-05-13 ENCOUNTER — Inpatient Hospital Stay (HOSPITAL_COMMUNITY): Payer: Self-pay | Admitting: Certified Registered"

## 2018-05-13 ENCOUNTER — Encounter (HOSPITAL_COMMUNITY)
Admission: EM | Disposition: A | Discharge status: Home / Self Care | Payer: Self-pay | Source: Home / Self Care | Attending: Internal Medicine

## 2018-05-13 ENCOUNTER — Inpatient Hospital Stay (HOSPITAL_COMMUNITY): Payer: Self-pay

## 2018-05-13 DIAGNOSIS — D72829 Elevated white blood cell count, unspecified: Secondary | ICD-10-CM

## 2018-05-13 HISTORY — PX: I&D EXTREMITY: SHX5045

## 2018-05-13 LAB — BASIC METABOLIC PANEL
Anion gap: 8 (ref 5–15)
BUN: 6 mg/dL (ref 6–20)
CALCIUM: 8.1 mg/dL — AB (ref 8.9–10.3)
CO2: 22 mmol/L (ref 22–32)
CREATININE: 0.65 mg/dL (ref 0.61–1.24)
Chloride: 104 mmol/L (ref 98–111)
GFR calc Af Amer: >60 mL/min (ref >60)
Glucose, Bld: 213 mg/dL — ABNORMAL HIGH (ref 70–99)
Potassium: 3.8 mmol/L (ref 3.5–5.1)
SODIUM: 134 mmol/L — AB (ref 135–145)

## 2018-05-13 LAB — CBC
HCT: 34.6 % — ABNORMAL LOW (ref 39.0–52.0)
Hemoglobin: 11.3 g/dL — ABNORMAL LOW (ref 13.0–17.0)
MCH: 28 pg (ref 26.0–34.0)
MCHC: 32.7 g/dL (ref 30.0–36.0)
MCV: 85.9 fL (ref 80.0–100.0)
PLATELETS: 299 10*3/uL (ref 150–400)
RBC: 4.03 MIL/uL — ABNORMAL LOW (ref 4.22–5.81)
RDW: 14.5 % (ref 11.5–15.5)
WBC: 22.8 10*3/uL — AB (ref 4.0–10.5)
nRBC: 0 % (ref 0.0–0.2)

## 2018-05-13 LAB — HIV ANTIBODY (ROUTINE TESTING W REFLEX): HIV SCREEN 4TH GENERATION: NONREACTIVE

## 2018-05-13 SURGERY — IRRIGATION AND DEBRIDEMENT EXTREMITY
Anesthesia: General | Site: Elbow | Laterality: Left

## 2018-05-13 MED ORDER — PROPOFOL 10 MG/ML IV BOLUS
INTRAVENOUS | Status: DC | PRN
  Administered 2018-05-13: 40 mg via INTRAVENOUS
  Administered 2018-05-13: 160 mg via INTRAVENOUS

## 2018-05-13 MED ORDER — OXYCODONE HCL 5 MG PO TABS
5.0000 mg | ORAL_TABLET | Freq: Once | ORAL | Status: AC | PRN
  Administered 2018-05-13: 5 mg via ORAL

## 2018-05-13 MED ORDER — LIDOCAINE 2% (20 MG/ML) 5 ML SYRINGE
INTRAMUSCULAR | Status: AC
  Filled 2018-05-13: qty 5

## 2018-05-13 MED ORDER — MIDAZOLAM HCL 5 MG/5ML IJ SOLN
INTRAMUSCULAR | Status: DC | PRN
  Administered 2018-05-13: 2 mg via INTRAVENOUS

## 2018-05-13 MED ORDER — MUPIROCIN 2 % EX OINT
1.0000 "application " | TOPICAL_OINTMENT | Freq: Two times a day (BID) | CUTANEOUS | Status: DC
  Administered 2018-05-13 – 2018-05-14 (×5): 1 via NASAL
  Filled 2018-05-13: qty 22

## 2018-05-13 MED ORDER — ROCURONIUM BROMIDE 50 MG/5ML IV SOSY
PREFILLED_SYRINGE | INTRAVENOUS | Status: AC
  Filled 2018-05-13: qty 5

## 2018-05-13 MED ORDER — HYDROMORPHONE HCL 1 MG/ML IJ SOLN
0.2500 mg | INTRAMUSCULAR | Status: DC | PRN
  Administered 2018-05-13 (×4): 0.5 mg via INTRAVENOUS

## 2018-05-13 MED ORDER — MIDAZOLAM HCL 2 MG/2ML IJ SOLN
INTRAMUSCULAR | Status: AC
  Filled 2018-05-13: qty 2

## 2018-05-13 MED ORDER — OXYCODONE HCL 5 MG/5ML PO SOLN: 5.0000 mg | Freq: Once | ORAL | Status: AC | PRN

## 2018-05-13 MED ORDER — CEFAZOLIN SODIUM 1 G IJ SOLR
INTRAMUSCULAR | Status: AC
  Filled 2018-05-13: qty 20

## 2018-05-13 MED ORDER — GADOBUTROL 1 MMOL/ML IV SOLN
7.0000 mL | Freq: Once | INTRAVENOUS | Status: AC | PRN
  Administered 2018-05-13: 7 mL via INTRAVENOUS

## 2018-05-13 MED ORDER — CHLORHEXIDINE GLUCONATE CLOTH 2 % EX PADS
6.0000 | MEDICATED_PAD | Freq: Every day | CUTANEOUS | Status: DC
  Administered 2018-05-14: 6 via TOPICAL

## 2018-05-13 MED ORDER — CEFAZOLIN SODIUM 1 G IJ SOLR
INTRAMUSCULAR | Status: DC | PRN
  Administered 2018-05-13: 2 g via INTRAMUSCULAR

## 2018-05-13 MED ORDER — PROPOFOL 10 MG/ML IV BOLUS
INTRAVENOUS | Status: AC
  Filled 2018-05-13: qty 20

## 2018-05-13 MED ORDER — HYDROMORPHONE HCL 1 MG/ML IJ SOLN
INTRAMUSCULAR | Status: AC
  Administered 2018-05-13: 0.5 mg via INTRAVENOUS
  Filled 2018-05-13: qty 1

## 2018-05-13 MED ORDER — VANCOMYCIN HCL IN DEXTROSE 1-5 GM/200ML-% IV SOLN
1000.0000 mg | Freq: Once | INTRAVENOUS | Status: DC
  Filled 2018-05-13: qty 200

## 2018-05-13 MED ORDER — IBUPROFEN 200 MG PO TABS
800.0000 mg | ORAL_TABLET | Freq: Four times a day (QID) | ORAL | Status: DC | PRN
  Administered 2018-05-13 – 2018-05-15 (×5): 800 mg via ORAL
  Filled 2018-05-13 (×5): qty 4

## 2018-05-13 MED ORDER — DEXAMETHASONE SODIUM PHOSPHATE 10 MG/ML IJ SOLN
INTRAMUSCULAR | Status: DC | PRN
  Administered 2018-05-13: 10 mg via INTRAVENOUS

## 2018-05-13 MED ORDER — LIDOCAINE HCL (CARDIAC) PF 100 MG/5ML IV SOSY
PREFILLED_SYRINGE | INTRAVENOUS | Status: DC | PRN
  Administered 2018-05-13: 80 mg via INTRAVENOUS

## 2018-05-13 MED ORDER — LACTATED RINGERS IV SOLN
INTRAVENOUS | Status: DC | PRN
  Administered 2018-05-13: 07:00:00 via INTRAVENOUS

## 2018-05-13 MED ORDER — ONDANSETRON HCL 4 MG/2ML IJ SOLN: 4.0000 mg | Freq: Four times a day (QID) | INTRAMUSCULAR | Status: DC | PRN

## 2018-05-13 MED ORDER — FENTANYL CITRATE (PF) 100 MCG/2ML IJ SOLN
INTRAMUSCULAR | Status: DC | PRN
  Administered 2018-05-13: 25 ug via INTRAVENOUS
  Administered 2018-05-13: 50 ug via INTRAVENOUS
  Administered 2018-05-13 (×3): 25 ug via INTRAVENOUS

## 2018-05-13 MED ORDER — VANCOMYCIN HCL IN DEXTROSE 1-5 GM/200ML-% IV SOLN
1000.0000 mg | Freq: Two times a day (BID) | INTRAVENOUS | Status: DC
  Administered 2018-05-13 – 2018-05-15 (×4): 1000 mg via INTRAVENOUS
  Filled 2018-05-13 (×4): qty 200

## 2018-05-13 MED ORDER — ONDANSETRON HCL 4 MG/2ML IJ SOLN
INTRAMUSCULAR | Status: AC
  Filled 2018-05-13: qty 2

## 2018-05-13 MED ORDER — OXYCODONE HCL 5 MG PO TABS
ORAL_TABLET | ORAL | Status: AC
  Administered 2018-05-13: 5 mg via ORAL
  Filled 2018-05-13: qty 1

## 2018-05-13 MED ORDER — DEXAMETHASONE SODIUM PHOSPHATE 10 MG/ML IJ SOLN
INTRAMUSCULAR | Status: AC
  Filled 2018-05-13: qty 1

## 2018-05-13 MED ORDER — SUCCINYLCHOLINE CHLORIDE 200 MG/10ML IV SOSY
PREFILLED_SYRINGE | INTRAVENOUS | Status: AC
  Filled 2018-05-13: qty 10

## 2018-05-13 MED ORDER — FENTANYL CITRATE (PF) 250 MCG/5ML IJ SOLN
INTRAMUSCULAR | Status: AC
  Filled 2018-05-13: qty 5

## 2018-05-13 MED ORDER — SODIUM CHLORIDE 0.9 % IR SOLN
Status: DC | PRN
  Administered 2018-05-13: 3000 mL

## 2018-05-13 SURGICAL SUPPLY — 27 items
BLADE SURG 10 STRL SS (BLADE) ×3 IMPLANT
BNDG GAUZE ELAST 4 BULKY (GAUZE/BANDAGES/DRESSINGS) ×3 IMPLANT
COVER SURGICAL LIGHT HANDLE (MISCELLANEOUS) ×3 IMPLANT
FACESHIELD WRAPAROUND (MASK) ×3 IMPLANT
GAUZE SPONGE 4X4 12PLY STRL (GAUZE/BANDAGES/DRESSINGS) ×3 IMPLANT
GLOVE BIO SURGEON STRL SZ8 (GLOVE) ×3 IMPLANT
GOWN STRL REUS W/ TWL LRG LVL3 (GOWN DISPOSABLE) ×1 IMPLANT
GOWN STRL REUS W/TWL 2XL LVL3 (GOWN DISPOSABLE) ×3 IMPLANT
GOWN STRL REUS W/TWL LRG LVL3 (GOWN DISPOSABLE) ×2
HANDPIECE INTERPULSE COAX TIP (DISPOSABLE)
KIT BASIN OR (CUSTOM PROCEDURE TRAY) ×3 IMPLANT
KIT TURNOVER KIT B (KITS) ×3 IMPLANT
NS IRRIG 1000ML POUR BTL (IV SOLUTION) ×3 IMPLANT
PACK ORTHO EXTREMITY (CUSTOM PROCEDURE TRAY) ×3 IMPLANT
PAD ABD 8X10 STRL (GAUZE/BANDAGES/DRESSINGS) ×3 IMPLANT
PAD ARMBOARD 7.5X6 YLW CONV (MISCELLANEOUS) ×3 IMPLANT
SET HNDPC FAN SPRY TIP SCT (DISPOSABLE) IMPLANT
SPONGE LAP 18X18 X RAY DECT (DISPOSABLE) ×3 IMPLANT
STOCKINETTE IMPERVIOUS 9X36 MD (GAUZE/BANDAGES/DRESSINGS) ×3 IMPLANT
SUT ETHILON 2 0 FS 18 (SUTURE) ×6 IMPLANT
SWAB COLLECTION DEVICE MRSA (MISCELLANEOUS) ×3 IMPLANT
SWAB CULTURE ESWAB REG 1ML (MISCELLANEOUS) ×3 IMPLANT
TOWEL OR 17X26 10 PK STRL BLUE (TOWEL DISPOSABLE) ×3 IMPLANT
TUBE CONNECTING 12'X1/4 (SUCTIONS) ×1
TUBE CONNECTING 12X1/4 (SUCTIONS) ×2 IMPLANT
UNDERPAD 30X30 (UNDERPADS AND DIAPERS) ×3 IMPLANT
YANKAUER SUCT BULB TIP NO VENT (SUCTIONS) ×6 IMPLANT

## 2018-05-13 NOTE — Progress Notes (Signed)
Patient ID: Jeffrey Schultz, male   DOB: 05-Oct-1980, 37 y.o.   MRN: [id number]  PROGRESS NOTE    KRRISH FREUND  UVO:[id number] DOB: 05-17-1981 DOA: 05/12/2018 PCP: Patient, No Pcp Per   Brief Narrative:  37 year old male with history of opioid and tobacco abuse presented with left arm erythema and drainage.  He was admitted with left upper extremity abscess and started on IV vancomycin.  He was transferred to [facility] as per orthopedics recommendations.  MRI of the left elbow confirmed the presence of abscess.   Assessment & Plan:   Active Problems:   Tobacco use disorder   Abscess of left arm   Opioid dependence (HCC)  Left upper extremity abscess -Status post I&D done by orthopedics today.  Follow cultures which are negative so far.  Continue vancomycin -Pain management  Leukocytosis -Probably secondary to above -Repeat a.m. labs  Tobacco abuse and opiate abuse -Patient was counseled by prior hospitalist -Patient denies intravenous drug use.  UDS negative    DVT prophylaxis: Heparin Code Status: Full Family Communication: None at bedside Disposition Plan: Home in 1 to 2 days  Consultants: Orthopedics  Procedures: I&D on 05/12/2018  Antimicrobials: Vancomycin from 05/12/2018 onwards   Subjective: Patient seen and examined at bedside.  He just returned back from I&D.  Complains of left arm pain.  No overnight fever or vomiting.  Objective: Vitals:   05/13/18 0815 05/13/18 0830 05/13/18 0845 05/13/18 0900  BP: 117/72 119/74 132/75 123/73  Pulse: 97 95 85 89  Resp: 14 (!) 26 (!) 22 14  Temp: 98.2 F (36.8 C)   98.8 F (37.1 C)  TempSrc:      SpO2: 100% 99% 100% 98%  Weight:      Height:        Intake/Output Summary (Last 24 hours) at 05/13/2018 0936 Last data filed at 05/13/2018 0855 Gross per 24 hour  Intake 4858.82 ml  Output 170 ml  Net 4688.82 ml   Filed Weights   05/12/18 0956  Weight: 64.4 kg    Examination:  General exam:  Appears calm and comfortable  Respiratory system: Bilateral decreased breath sounds at bases Cardiovascular system: S1 & S2 heard, Rate controlled Gastrointestinal system: Abdomen is nondistended, soft and nontender. Normal bowel sounds heard. Extremities: No cyanosis, clubbing; left arm dressing present  Data Reviewed: I have personally reviewed following labs and imaging studies  CBC: Recent Labs  Lab 05/12/18 1126  WBC 21.5*  NEUTROABS 18.8*  HGB 13.4  HCT 42.0  MCV 85.5  PLT 357   Basic Metabolic Panel: Recent Labs  Lab 05/12/18 1126  NA 135  K 3.9  CL 100  CO2 25  GLUCOSE 109*  BUN 9  CREATININE 0.53*  CALCIUM 9.0   GFR: Estimated Creatinine Clearance: 117.4 mL/min (A) (by C-G formula based on SCr of 0.53 mg/dL (L)). Liver Function Tests: Recent Labs  Lab 05/12/18 1126  AST 23  ALT 37  ALKPHOS 63  BILITOT 1.1  PROT 7.6  ALBUMIN 3.5   No results for input(s): LIPASE, AMYLASE in the last 168 hours. No results for input(s): AMMONIA in the last 168 hours. Coagulation Profile: No results for input(s): INR, PROTIME in the last 168 hours. Cardiac Enzymes: No results for input(s): CKTOTAL, CKMB, CKMBINDEX, TROPONINI in the last 168 hours. BNP (last 3 results) No results for input(s): PROBNP in the last 8760 hours. HbA1C: No results for input(s): HGBA1C in the last 72 hours. CBG: No results for input(s):  GLUCAP in the last 168 hours. Lipid Profile: No results for input(s): CHOL, HDL, LDLCALC, TRIG, CHOLHDL, LDLDIRECT in the last 72 hours. Thyroid Function Tests: No results for input(s): TSH, T4TOTAL, FREET4, T3FREE, THYROIDAB in the last 72 hours. Anemia Panel: No results for input(s): VITAMINB12, FOLATE, FERRITIN, TIBC, IRON, RETICCTPCT in the last 72 hours. Sepsis Labs: No results for input(s): PROCALCITON, LATICACIDVEN in the last 168 hours.  Recent Results (from the past 240 hour(s))  Culture, blood (Routine X 2) w Reflex to ID Panel     Status: None  (Preliminary result)   Collection Time: 05/12/18 11:26 AM  Result Value Ref Range Status   Specimen Description BLOOD RIGHT HAND  Final   Special Requests   Final    BOTTLES DRAWN AEROBIC AND ANAEROBIC Blood Culture adequate volume   Culture   Final    NO GROWTH < 24 HOURS Performed at [facility], [address], [place], [place] [address]    Report Status PENDING  Incomplete  Culture, blood (Routine X 2) w Reflex to ID Panel     Status: None (Preliminary result)   Collection Time: 05/12/18 11:37 AM  Result Value Ref Range Status   Specimen Description BLOOD LEFT HAND  Final   Special Requests   Final    BOTTLES DRAWN AEROBIC AND ANAEROBIC Blood Culture adequate volume   Culture   Final    NO GROWTH < 24 HOURS Performed at [facility], [address], [place], [place] [address]    Report Status PENDING  Incomplete  Surgical pcr screen     Status: Abnormal   Collection Time: 05/12/18  6:41 PM  Result Value Ref Range Status   MRSA, PCR NEGATIVE NEGATIVE Final   Staphylococcus aureus POSITIVE (A) NEGATIVE Final    Comment: (NOTE) The Xpert SA Assay (FDA approved for NASAL specimens in patients 21 years of age and older), is one component of a comprehensive surveillance program. It is not intended to diagnose infection nor to guide or monitor treatment. Performed at [facility], [address], [place], [place] [address]          Radiology Studies: Dg Elbow Complete Left (3+view)  Result Date: 05/12/2018 CLINICAL DATA:  Left elbow pain. EXAM: LEFT ELBOW - COMPLETE 3+ VIEW COMPARISON:  None. FINDINGS: There is no evidence of fracture, dislocation, or joint effusion. There is no evidence of arthropathy or other focal bone abnormality. Mild diffuse soft tissue swelling noted. No evidence of soft tissue gas or radiopaque foreign body. IMPRESSION: Diffuse soft tissue swelling.  No osseous abnormality. Electronically Signed   By: Myles Rosenthal M.D.   On: 05/12/2018 19:12    Mr Elbow Left W Wo Contrast  Result Date: 05/13/2018 CLINICAL DATA:  Elbow pain and erythema. EXAM: MRI OF THE LEFT ELBOW WITHOUT AND WITH CONTRAST TECHNIQUE: Multiplanar, multisequence MR imaging of the elbow was performed before and after the administration of intravenous contrast. CONTRAST:  7 mL Gadovist COMPARISON:  Left elbow radiographs 05/12/2018 FINDINGS: Markers are placed marking the area of interest. The examination is severely limited in technical quality due to large amount of susceptibility artifact. Diffuse infiltration and edema in the biceps muscle consistent with myositis. There is an intramuscular fluid collection measuring 2.6 x 9.8 cm in axial dimension and measuring at least 6.6 cm longitudinally. The full extent of the lesion is not included within the field of view. Mild enhancement of the surrounding biceps muscle period. TENDONS Limited visualization due to technique.  Visualized tendons are not displaced. Biceps tendon is intact. LIGAMENTS Medial stabilizers: Not visualized. Lateral stabilizers:  Not visualized. Cartilage: Not visualized. Joint: Not well visualized.  No significant joint effusion. Cubital tunnel: Not well visualized. Bones: Not well visualized.  No obvious marrow signal changes. IMPRESSION: Abscess demonstrated in the anterior compartment musculature with surrounding muscular edema and myositis. Examination is significantly limited due to motion artifact and susceptibility artifact. Electronically Signed   By: Burman Nieves M.D.   On: 05/13/2018 06:32        Scheduled Meds: . Chlorhexidine Gluconate Cloth  6 each Topical Daily  . heparin  5,000 Units Subcutaneous Q8H  . mupirocin ointment  1 application Nasal BID  . nicotine  21 mg Transdermal Daily   Continuous Infusions: . sodium chloride 1,000 mL (05/12/18 1347)  . sodium chloride 125 mL/hr at 05/12/18 2313  . vancomycin    . vancomycin       LOS: 1 day        Glade Lloyd,  MD [facility] Pager [phone]  If 7PM-7AM, please contact night-coverage [email or web address] Password [facility] 05/13/2018, 9:36 AM

## 2018-05-13 NOTE — Transfer of Care (Signed)
Immediate Anesthesia Transfer of Care Note  Patient: Jeffrey Schultz  Procedure(s) Performed: IRRIGATION AND DEBRIDEMENT ELBOW ABSCESS (Left )  Patient Location: PACU  Anesthesia Type:General  Level of Consciousness: awake, alert , oriented and patient cooperative  Airway & Oxygen Therapy: Patient Spontanous Breathing  Post-op Assessment: Report given to RN and Post -op Vital signs reviewed and stable  Post vital signs: Reviewed and stable  Last Vitals: 117/72, 95, 20, 100 Vitals Value Taken Time  BP 117/72 05/13/2018  8:15 AM  Temp    Pulse 99 05/13/2018  8:14 AM  Resp    SpO2 100 % 05/13/2018  8:14 AM  Vitals shown include unvalidated device data.  Last Pain:  Vitals:   05/13/18 0630  TempSrc: Oral  PainSc:          Complications: No apparent anesthesia complications

## 2018-05-13 NOTE — Anesthesia Procedure Notes (Signed)
Procedure Name: LMA Insertion Date/Time: 05/13/2018 7:25 AM Performed by: Sonda Primes, CRNA Pre-anesthesia Checklist: Patient identified, Emergency Drugs available, Suction available and Patient being monitored Patient Re-evaluated:Patient Re-evaluated prior to induction Oxygen Delivery Method: Circle System Utilized Preoxygenation: Pre-oxygenation with 100% oxygen Induction Type: IV induction Ventilation: Mask ventilation without difficulty LMA: LMA inserted LMA Size: 4.0 Number of attempts: 1 Airway Equipment and Method: Bite block Placement Confirmation: positive ETCO2 Tube secured with: Tape Dental Injury: Teeth and Oropharynx as per pre-operative assessment

## 2018-05-13 NOTE — Anesthesia Preprocedure Evaluation (Signed)
Anesthesia Evaluation  Patient identified by MRN, date of birth, ID band Patient awake    Reviewed: Allergy & Precautions, H&P , NPO status , Patient's Chart, lab work & pertinent test results  Airway Mallampati: II   Neck ROM: full    Dental   Pulmonary Current Smoker,    breath sounds clear to auscultation       Cardiovascular negative cardio ROS   Rhythm:regular Rate:Normal     Neuro/Psych PSYCHIATRIC DISORDERS Depression    GI/Hepatic (+)     substance abuse  marijuana use and IV drug use,   Endo/Other    Renal/GU      Musculoskeletal  (+) narcotic dependent  Abdominal   Peds  Hematology   Anesthesia Other Findings   Reproductive/Obstetrics                             Anesthesia Physical Anesthesia Plan  ASA: II  Anesthesia Plan: General   Post-op Pain Management:    Induction: Intravenous  PONV Risk Score and Plan: 2 and Ondansetron, Dexamethasone, Midazolam and Treatment may vary due to age or medical condition  Airway Management Planned: LMA  Additional Equipment:   Intra-op Plan:   Post-operative Plan:   Informed Consent: I have reviewed the patients History and Physical, chart, labs and discussed the procedure including the risks, benefits and alternatives for the proposed anesthesia with the patient or authorized representative who has indicated his/her understanding and acceptance.     Plan Discussed with: CRNA, Anesthesiologist and Surgeon  Anesthesia Plan Comments:         Anesthesia Quick Evaluation

## 2018-05-13 NOTE — Op Note (Signed)
Jeffrey Schultz male 37 y.o. 05/13/2018  PreOperative Diagnosis: Left upper extremity deep abscess  PostOperative Diagnosis: Same   Procedure(s) and Anesthesia Type:    * Incision and drainage DEEP ELBOW ABSCESS - General  Surgeon: Terance Hart   Assistants: none  Anesthesia: General LMA anesthesia   Findings: Ulceration on the distal radial aspect of the antecubital fossa with purulent drainage. Large collection of purulent material and abscess in anterolateral compartment of left arm within biceps musculature.  Implants: none  Indications:37 y.o. male presented to the emergency department with a draining purulent wound on the radial aspect of his proximal forearm.  There was found to be significant amount of induration and swelling on the lateral aspect of his arm proximally.  Ultrasound and MRI revealed a large fluid collection within the anterior compartment of the arm.  Patient states he had a laceration at work a couple of weeks ago in this area.  The pain is been present for about 1 week.  It is been worsening.  On evaluation he did not have evidence of a septic elbow given that he is able to range his elbow fine.  After evaluation the MRI of the patient he was indicated for incision and drainage of deep arm abscess.  The risks, benefits and alternatives to the surgery were discussed with him.  He understood these risks and opted to proceed with surgery.  Procedure Detail: The patient was seen in the preoperative holding area and the left upper extremity was marked by myself.  This is appropriate operative extremity.  The consent was signed myself and the patient.  He was taken to the operating room placed supine operative table with an arm table out on the left side.  The dressing was removed and there was a 2 cm ulceration within the proximal radial forearm.  There is a large amount of purulent drainage from this.  The left upper extremity was prepped and draped in the  usual sterile fashion.  2 g of Ancef was given.  Surgical pause was given identifying the left upper extremity and the proposed surgical procedure.  I began the procedure by making a 3 cm extension proximally from the site of the ulceration.  This was taken sharply down through skin and subcutaneous tissue.  Then blunt dissection was used to identify the overlying fascia.  This was incised.  Then using blunt dissection was able to follow the track of the drainage up into the biceps musculature.  There is a large rush of purulent fluid that was gray and tan in color.  It was foul-smelling.  I was unable to follow this bluntly up proximally into the arm about halfway up the lateral arm.  Then culture was taken with swab and of the deep abscess material.  Then 3 L of normal saline was irrigated up into this abscess and track.  Then sharply I incised necrotic tissue at the site of the ulceration.  This was all done without a tourniquet.  There was no significant bleeding.  After this 1/2 inch Penrose drain was placed along the abscess track and sutured in place.  The wound was partially closed over the drain using 2-0 nylon.  4 x 4's, ABD pad and Kerlix wrap was used for dressing.  The counts were correct at the end of the case.  There were no complications.  Post Op Instructions: Change dressing as needed for drainage. Leave Penrose drain in place until drainage ceases. Continue antibiotics per hospitalist  team and follow-up culture results. He will follow-up with me in 2 weeks for wound check.  Tourniquette Time:none  Estimated Blood Loss:  less than 50 mL         Drains: none  Blood Given: none         Specimens: none       Complications:  * No complications entered in OR log *         Disposition: PACU - hemodynamically stable.         Condition: stable

## 2018-05-13 NOTE — Plan of Care (Signed)
  Problem: Skin Integrity: Goal: Risk for impaired skin integrity will decrease 05/13/2018 0142 by Charlotte Sanes, RN Outcome: Progressing 05/13/2018 0138 by Charlotte Sanes, RN Outcome: Progressing   Problem: Safety: Goal: Ability to remain free from injury will improve 05/13/2018 0142 by Charlotte Sanes, RN Outcome: Progressing 05/13/2018 0138 by Charlotte Sanes, RN Outcome: Progressing   Problem: Pain Managment: Goal: General experience of comfort will improve 05/13/2018 0142 by Charlotte Sanes, RN Outcome: Progressing 05/13/2018 0138 by Charlotte Sanes, RN Outcome: Progressing

## 2018-05-13 NOTE — Anesthesia Postprocedure Evaluation (Signed)
Anesthesia Post Note  Patient: Jeffrey Schultz  Procedure(s) Performed: IRRIGATION AND DEBRIDEMENT ELBOW ABSCESS (Left Elbow)     Patient location during evaluation: PACU Anesthesia Type: General Level of consciousness: awake and alert Pain management: pain level controlled Vital Signs Assessment: post-procedure vital signs reviewed and stable Respiratory status: spontaneous breathing, nonlabored ventilation, respiratory function stable and patient connected to nasal cannula oxygen Cardiovascular status: blood pressure returned to baseline and stable Postop Assessment: no apparent nausea or vomiting Anesthetic complications: no    Last Vitals:  Vitals:   05/13/18 0940 05/13/18 1556  BP: 108/69 131/74  Pulse: 88 98  Resp: 16 18  Temp: 37.2 C 37.1 C  SpO2: 98% 98%    Last Pain:  Vitals:   05/13/18 1556  TempSrc: Oral  PainSc:                  [name]

## 2018-05-13 NOTE — Progress Notes (Signed)
Subjective: Still complains of pain in left elbow and arm.  Denies numbness or tingling.  Denies any fever of chills.  No other joint pains today.   Objective: Vital signs in last 24 hours: Temp:  [98.5 F (36.9 C)-99.8 F (37.7 C)] 98.9 F (37.2 C) (10/13 0630) Pulse Rate:  [91-122] 91 (10/13 0630) Resp:  [16] 16 (10/13 0630) BP: (120-135)/(69-95) 134/77 (10/13 0630) SpO2:  [97 %-100 %] 100 % (10/13 0630) Weight:  [64.4 kg] 64.4 kg (10/12 0956)  Intake/Output from previous day: 10/12 0701 - 10/13 0700 In: 3708.8 [I.V.:1658.8; IV Piggyback:2050] Out: -  Intake/Output this shift: No intake/output data recorded.  Recent Labs    05/12/18 1126  HGB 13.4   Recent Labs    05/12/18 1126  WBC 21.5*  RBC 4.91  HCT 42.0  PLT 357   Recent Labs    05/12/18 1126  NA 135  K 3.9  CL 100  CO2 25  BUN 9  CREATININE 0.53*  GLUCOSE 109*  CALCIUM 9.0   No results for input(s): LABPT, INR in the last 72 hours.  Awake and alert.   Left arm with induration and erythema.  Draining wound on radial dorsal forearm. SILT hand in m,u,r nerves.   MRI positive for abcsess in anterior compartment muscles of arm.  Assessment/Plan: Will proceed with incision and drainage of arm abscess.  Will obtain cultures.  He understands risks and benefits.     Terance Hart 05/13/2018, 7:02 AM

## 2018-05-14 ENCOUNTER — Encounter (HOSPITAL_COMMUNITY): Payer: Self-pay | Admitting: Orthopaedic Surgery

## 2018-05-14 LAB — CBC WITH DIFFERENTIAL/PLATELET
Basophils Absolute: 0 10*3/uL (ref 0.0–0.1)
Basophils Relative: 0 %
EOS PCT: 0 %
Eosinophils Absolute: 0 10*3/uL (ref 0.0–0.5)
HEMATOCRIT: 34.2 % — AB (ref 39.0–52.0)
Hemoglobin: 11.2 g/dL — ABNORMAL LOW (ref 13.0–17.0)
LYMPHS ABS: 1 10*3/uL (ref 0.7–4.0)
LYMPHS PCT: 4 %
MCH: 28.3 pg (ref 26.0–34.0)
MCHC: 32.7 g/dL (ref 30.0–36.0)
MCV: 86.4 fL (ref 80.0–100.0)
MONO ABS: 1.3 10*3/uL — AB (ref 0.1–1.0)
Monocytes Relative: 5 %
Neutro Abs: 23.8 10*3/uL — ABNORMAL HIGH (ref 1.7–7.7)
Neutrophils Relative %: 91 %
Platelets: 349 10*3/uL (ref 150–400)
RBC: 3.96 MIL/uL — ABNORMAL LOW (ref 4.22–5.81)
RDW: 14.3 % (ref 11.5–15.5)
WBC: 26.2 10*3/uL — ABNORMAL HIGH (ref 4.0–10.5)
nRBC: 0 % (ref 0.0–0.2)
nRBC: 0 /100 WBC

## 2018-05-14 LAB — BASIC METABOLIC PANEL
Anion gap: 8 (ref 5–15)
BUN: 11 mg/dL (ref 6–20)
CALCIUM: 8.6 mg/dL — AB (ref 8.9–10.3)
CHLORIDE: 108 mmol/L (ref 98–111)
CO2: 22 mmol/L (ref 22–32)
CREATININE: 0.57 mg/dL — AB (ref 0.61–1.24)
GFR calc Af Amer: >60 mL/min (ref >60)
GFR calc non Af Amer: >60 mL/min (ref >60)
GLUCOSE: 143 mg/dL — AB (ref 70–99)
Potassium: 3.8 mmol/L (ref 3.5–5.1)
Sodium: 138 mmol/L (ref 135–145)

## 2018-05-14 LAB — MAGNESIUM: Magnesium: 1.7 mg/dL (ref 1.7–2.4)

## 2018-05-14 LAB — C-REACTIVE PROTEIN: CRP: 12.8 mg/dL — ABNORMAL HIGH (ref <1.0)

## 2018-05-14 NOTE — Progress Notes (Signed)
Subjective: 1 Day Post-Op Procedure(s) (LRB): IRRIGATION AND DEBRIDEMENT ELBOW ABSCESS (Left) Patient states pain is improved in the left arm.  He is able to range his elbow.  No fevers or chills.  Objective: Vital signs in last 24 hours: Temp:  [98.4 F (36.9 C)-98.7 F (37.1 C)] 98.4 F (36.9 C) (10/14 0843) Pulse Rate:  [74-98] 76 (10/14 0843) Resp:  [18] 18 (10/13 1556) BP: (101-131)/(62-74) 120/66 (10/14 0843) SpO2:  [94 %-99 %] 98 % (10/14 0843)  Recent Labs    05/12/18 1126 05/13/18 0948 05/14/18 0249  HGB 13.4 11.3* 11.2*   Recent Labs    05/13/18 0948 05/14/18 0249  WBC 22.8* 26.2*  RBC 4.03* 3.96*  HCT 34.6* 34.2*  PLT 299 349   Awake and alert.  Family at bedside. Left upper extremity with dressing in place.  He showed me a picture from earlier dressing change.  There is some continued drainage.   Endorses silt in left upper extremity.  Is able to move all digits.   Microbiology: Few gram-negative rods seen on culture.  Assessment/Plan: 1 Day Post-Op Procedure(s) (LRB): IRRIGATION AND DEBRIDEMENT ELBOW ABSCESS (Left)  Michelle seems to be somewhat improved after surgery.  His pain has improved.  He continues to have some pain in the medial and lateral arms.  Redness is better.  There is some gram-negative rods seen on intraoperative culture..  I agree with keeping him 1 more night.  Hopefully we can tailor the antibiotics.    I will take a look at the wound in the morning and determine if drain can be removed.  Likely he will be discharged with it with close follow-up for drain removal and wound check.    Terance Hart 05/14/2018, 1:04 PM

## 2018-05-14 NOTE — Progress Notes (Signed)
Patient ID: RUPERT AZZARA, male   DOB: 09-10-1980, 37 y.o.   MRN: [id number]  PROGRESS NOTE    MARICE ANGELINO  WUJ:[id number] DOB: 20-May-1981 DOA: 05/12/2018 PCP: Patient, No Pcp Per   Brief Narrative:  37 year old male with history of opioid and tobacco abuse presented with left arm erythema and drainage.  He was admitted with left upper extremity abscess and started on IV vancomycin.  He was transferred to [facility] [place] as per orthopedics recommendations.  MRI of the left elbow confirmed the presence of abscess.   Assessment & Plan:   Active Problems:   Tobacco use disorder   Abscess of left arm   Opioid dependence (HCC)  Left upper extremity abscess -Status post I&D done by orthopedics today.  Follow OR cultures which are negative so far.  Continue vancomycin -Continue pain management  Leukocytosis -Probably secondary to above.  Worse today. -Repeat a.m. labs  Tobacco abuse and opiate abuse -Patient was counseled by prior hospitalist -Patient denies intravenous drug use.  UDS negative    DVT prophylaxis: Heparin Code Status: Full Family Communication: None at bedside Disposition Plan: Home in 1 to 2 days  Consultants: Orthopedics  Procedures: I&D on 05/12/2018  Antimicrobials: Vancomycin from 05/12/2018 onwards   Subjective: Patient seen and examined at bedside.  Patient feels that his arm pain and swelling is improving.  No overnight fever, nausea or vomiting. Objective: Vitals:   05/13/18 0940 05/13/18 1556 05/13/18 1941 05/14/18 0649  BP: 108/69 131/74 126/72 101/62  Pulse: 88 98 89 74  Resp: 16 18    Temp: 98.9 F (37.2 C) 98.7 F (37.1 C) 98.4 F (36.9 C) 98.4 F (36.9 C)  TempSrc:  Oral Oral Oral  SpO2: 98% 98% 99% 94%  Weight:      Height:        Intake/Output Summary (Last 24 hours) at 05/14/2018 0831 Last data filed at 05/14/2018 9562 Gross per 24 hour  Intake 2390 ml  Output 150 ml  Net 2240 ml   Filed Weights   05/12/18  0956  Weight: 64.4 kg    Examination:  General exam: Appears calm and comfortable, no distress Respiratory system: Bilateral decreased breath sounds at bases, no wheezing Cardiovascular system: S1 & S2 heard, Rate controlled Gastrointestinal system: Abdomen is nondistended, soft and nontender. Normal bowel sounds heard. Extremities: No cyanosis; left arm dressing present  Data Reviewed: I have personally reviewed following labs and imaging studies  CBC: Recent Labs  Lab 05/12/18 1126 05/13/18 0948 05/14/18 0249  WBC 21.5* 22.8* 26.2*  NEUTROABS 18.8*  --  23.8*  HGB 13.4 11.3* 11.2*  HCT 42.0 34.6* 34.2*  MCV 85.5 85.9 86.4  PLT 357 299 349   Basic Metabolic Panel: Recent Labs  Lab 05/12/18 1126 05/13/18 0948 05/14/18 0249  NA 135 134* 138  K 3.9 3.8 3.8  CL 100 104 108  CO2 25 22 22   GLUCOSE 109* 213* 143*  BUN 9 6 11   CREATININE 0.53* 0.65 0.57*  CALCIUM 9.0 8.1* 8.6*  MG  --   --  1.7   GFR: Estimated Creatinine Clearance: 117.4 mL/min (A) (by C-G formula based on SCr of 0.57 mg/dL (L)). Liver Function Tests: Recent Labs  Lab 05/12/18 1126  AST 23  ALT 37  ALKPHOS 63  BILITOT 1.1  PROT 7.6  ALBUMIN 3.5   No results for input(s): LIPASE, AMYLASE in the last 168 hours. No results for input(s): AMMONIA in the last 168 hours. Coagulation Profile:  No results for input(s): INR, PROTIME in the last 168 hours. Cardiac Enzymes: No results for input(s): CKTOTAL, CKMB, CKMBINDEX, TROPONINI in the last 168 hours. BNP (last 3 results) No results for input(s): PROBNP in the last 8760 hours. HbA1C: No results for input(s): HGBA1C in the last 72 hours. CBG: No results for input(s): GLUCAP in the last 168 hours. Lipid Profile: No results for input(s): CHOL, HDL, LDLCALC, TRIG, CHOLHDL, LDLDIRECT in the last 72 hours. Thyroid Function Tests: No results for input(s): TSH, T4TOTAL, FREET4, T3FREE, THYROIDAB in the last 72 hours. Anemia Panel: No results for  input(s): VITAMINB12, FOLATE, FERRITIN, TIBC, IRON, RETICCTPCT in the last 72 hours. Sepsis Labs: No results for input(s): PROCALCITON, LATICACIDVEN in the last 168 hours.  Recent Results (from the past 240 hour(s))  Culture, blood (Routine X 2) w Reflex to ID Panel     Status: None (Preliminary result)   Collection Time: 05/12/18 11:26 AM  Result Value Ref Range Status   Specimen Description BLOOD RIGHT HAND  Final   Special Requests   Final    BOTTLES DRAWN AEROBIC AND ANAEROBIC Blood Culture adequate volume   Culture   Final    NO GROWTH 2 DAYS Performed at [facility], [address], [place], [place] [address]    Report Status PENDING  Incomplete  Culture, blood (Routine X 2) w Reflex to ID Panel     Status: None (Preliminary result)   Collection Time: 05/12/18 11:37 AM  Result Value Ref Range Status   Specimen Description BLOOD LEFT HAND  Final   Special Requests   Final    BOTTLES DRAWN AEROBIC AND ANAEROBIC Blood Culture adequate volume   Culture   Final    NO GROWTH 2 DAYS Performed at [facility], [address], [place], [place] [address]    Report Status PENDING  Incomplete  Surgical pcr screen     Status: Abnormal   Collection Time: 05/12/18  6:41 PM  Result Value Ref Range Status   MRSA, PCR NEGATIVE NEGATIVE Final   Staphylococcus aureus POSITIVE (A) NEGATIVE Final    Comment: (NOTE) The Xpert SA Assay (FDA approved for NASAL specimens in patients 50 years of age and older), is one component of a comprehensive surveillance program. It is not intended to diagnose infection nor to guide or monitor treatment. Performed at [facility], [address], [place], [place] [address]   Aerobic/Anaerobic Culture (surgical/deep wound)     Status: None (Preliminary result)   Collection Time: 05/13/18  7:55 AM  Result Value Ref Range Status   Specimen Description ABSCESS  Final   Special Requests LEFT ELBOW  Final   Gram Stain   Final    FEW WBC PRESENT,  PREDOMINANTLY PMN NO ORGANISMS SEEN Performed at [facility], [address] [place], [place] [address]    Culture PENDING  Incomplete   Report Status PENDING  Incomplete         Radiology Studies: Dg Elbow Complete Left (3+view)  Result Date: 05/12/2018 CLINICAL DATA:  Left elbow pain. EXAM: LEFT ELBOW - COMPLETE 3+ VIEW COMPARISON:  None. FINDINGS: There is no evidence of fracture, dislocation, or joint effusion. There is no evidence of arthropathy or other focal bone abnormality. Mild diffuse soft tissue swelling noted. No evidence of soft tissue gas or radiopaque foreign body. IMPRESSION: Diffuse soft tissue swelling.  No osseous abnormality. Electronically Signed   By: Myles Rosenthal M.D.   On: 05/12/2018 19:12  Mr Elbow Left W Wo Contrast  Result Date: 05/13/2018 CLINICAL DATA:  Elbow pain and erythema. EXAM: MRI OF THE LEFT ELBOW WITHOUT AND WITH CONTRAST TECHNIQUE: Multiplanar, multisequence MR imaging of the elbow was performed before and after the administration of intravenous contrast. CONTRAST:  7 mL Gadovist COMPARISON:  Left elbow radiographs 05/12/2018 FINDINGS: Markers are placed marking the area of interest. The examination is severely limited in technical quality due to large amount of susceptibility artifact. Diffuse infiltration and edema in the biceps muscle consistent with myositis. There is an intramuscular fluid collection measuring 2.6 x 9.8 cm in axial dimension and measuring at least 6.6 cm longitudinally. The full extent of the lesion is not included within the field of view. Mild enhancement of the surrounding biceps muscle period. TENDONS Limited visualization due to technique. Visualized tendons are not displaced. Biceps tendon is intact. LIGAMENTS Medial stabilizers: Not visualized. Lateral stabilizers:  Not visualized. Cartilage: Not visualized. Joint: Not well visualized.  No significant joint effusion. Cubital tunnel: Not well visualized. Bones: Not well  visualized.  No obvious marrow signal changes. IMPRESSION: Abscess demonstrated in the anterior compartment musculature with surrounding muscular edema and myositis. Examination is significantly limited due to motion artifact and susceptibility artifact. Electronically Signed   By: Burman Nieves M.D.   On: 05/13/2018 06:32        Scheduled Meds: . Chlorhexidine Gluconate Cloth  6 each Topical Daily  . heparin  5,000 Units Subcutaneous Q8H  . mupirocin ointment  1 application Nasal BID  . nicotine  21 mg Transdermal Daily   Continuous Infusions: . vancomycin    . vancomycin 1,000 mg (05/14/18 1610)     LOS: 2 days        Glade Lloyd, MD [facility] Pager [phone]  If 7PM-7AM, please contact night-coverage [email or web address] Password [email or web address] 05/14/2018, 8:31 AM

## 2018-05-15 LAB — BASIC METABOLIC PANEL
ANION GAP: 9 (ref 5–15)
BUN: 10 mg/dL (ref 6–20)
CO2: 24 mmol/L (ref 22–32)
Calcium: 8.4 mg/dL — ABNORMAL LOW (ref 8.9–10.3)
Chloride: 108 mmol/L (ref 98–111)
Creatinine, Ser: 0.55 mg/dL — ABNORMAL LOW (ref 0.61–1.24)
GFR calc Af Amer: >60 mL/min (ref >60)
GFR calc non Af Amer: >60 mL/min (ref >60)
GLUCOSE: 155 mg/dL — AB (ref 70–99)
POTASSIUM: 2.8 mmol/L — AB (ref 3.5–5.1)
Sodium: 141 mmol/L (ref 135–145)

## 2018-05-15 LAB — CBC WITH DIFFERENTIAL/PLATELET
ABS IMMATURE GRANULOCYTES: 0.04 10*3/uL (ref 0.00–0.07)
Basophils Absolute: 0 10*3/uL (ref 0.0–0.1)
Basophils Relative: 0 %
EOS ABS: 0.1 10*3/uL (ref 0.0–0.5)
Eosinophils Relative: 0 %
HEMATOCRIT: 33.5 % — AB (ref 39.0–52.0)
Hemoglobin: 10.8 g/dL — ABNORMAL LOW (ref 13.0–17.0)
IMMATURE GRANULOCYTES: 0 %
LYMPHS ABS: 2.4 10*3/uL (ref 0.7–4.0)
Lymphocytes Relative: 20 %
MCH: 28.2 pg (ref 26.0–34.0)
MCHC: 32.2 g/dL (ref 30.0–36.0)
MCV: 87.5 fL (ref 80.0–100.0)
MONOS PCT: 4 %
Monocytes Absolute: 0.5 10*3/uL (ref 0.1–1.0)
NEUTROS PCT: 76 %
Neutro Abs: 8.9 10*3/uL — ABNORMAL HIGH (ref 1.7–7.7)
Platelets: 392 10*3/uL (ref 150–400)
RBC: 3.83 MIL/uL — ABNORMAL LOW (ref 4.22–5.81)
RDW: 14.6 % (ref 11.5–15.5)
WBC: 11.9 10*3/uL — ABNORMAL HIGH (ref 4.0–10.5)
nRBC: 0 % (ref 0.0–0.2)

## 2018-05-15 LAB — MAGNESIUM: Magnesium: 1.7 mg/dL (ref 1.7–2.4)

## 2018-05-15 MED ORDER — MUPIROCIN 2 % EX OINT: TOPICAL_OINTMENT | CUTANEOUS | 0 refills | Status: AC

## 2018-05-15 MED ORDER — DOXYCYCLINE HYCLATE 100 MG PO TABS: 100.0000 mg | ORAL_TABLET | Freq: Two times a day (BID) | ORAL | 0 refills | Status: AC

## 2018-05-15 MED ORDER — IBUPROFEN 200 MG PO TABS: 800.0000 mg | ORAL_TABLET | Freq: Four times a day (QID) | ORAL | 0 refills | Status: AC | PRN

## 2018-05-15 MED ORDER — AMOXICILLIN-POT CLAVULANATE 875-125 MG PO TABS: 1.0000 | ORAL_TABLET | Freq: Two times a day (BID) | ORAL | 0 refills | Status: AC

## 2018-05-15 NOTE — Progress Notes (Signed)
Patient given all discharge instructions, all questions answered. All belongings sent with patient. He discharged home with family, was able to walk himself down to family vehicle. PIV x1 removed.

## 2018-05-15 NOTE — Discharge Summary (Signed)
Physician Discharge Summary  Jeffrey Schultz XBJ:[id number] DOB: 08/28/1980 DOA: 05/12/2018  PCP: Patient, No Pcp Per  Admit date: 05/12/2018 Discharge date: 05/15/2018  Admitted From: Home Disposition:  Home  Recommendations for Outpatient Follow-up:  1. Follow up with PCP in 1 week 2. Follow-up with orthopedic/Dr. Susa Simmonds as an outpatient.  Wound care as per orthopedics recommendations.   Home Health: No Equipment/Devices: None  Discharge Condition: Stable CODE STATUS: Full Diet recommendation:  Regular  Brief/Interim Summary: 37 year old male with history of opioid and tobacco abuse presented with left arm erythema and drainage.  He was admitted with left upper extremity abscess and started on IV vancomycin.  He was transferred to [facility] as per orthopedics recommendations.  MRI of the left elbow confirmed the presence of abscess.  She underwent I&D by orthopedics on 05/13/2018.  Or cultures is growing Serratia, sensitivities pending.  Orthopedics has cleared the patient for discharge.  He will be discharged on oral antibiotics.  Discharge Diagnoses:  Active Problems:   Tobacco use disorder   Abscess of left arm   Opioid dependence (HCC)  Left upper extremity abscess -Status post I&D done by orthopedics on 05/13/2018.  Or cultures is growing Serratia, sensitivities pending.  Orthopedics has cleared the patient for discharge. -Currently on vancomycin.  Will discharge home on Augmentin and doxycycline for a week.  Outpatient follow-up with orthopedics.  Wound care as per orthopedics recommendations.  Leukocytosis -Probably secondary to above.    Much improved.  Tobacco abuse and opiate abuse -Patient was counseled by prior hospitalist -Patient denies intravenous drug use.  UDS negative  Discharge Instructions  Discharge Instructions    Call MD for:  difficulty breathing, headache or visual disturbances   Complete by:  As directed    Call MD for:  extreme  fatigue   Complete by:  As directed    Call MD for:  hives   Complete by:  As directed    Call MD for:  persistant dizziness or light-headedness   Complete by:  As directed    Call MD for:  persistant nausea and vomiting   Complete by:  As directed    Call MD for:  redness, tenderness, or signs of infection (pain, swelling, redness, odor or green/yellow discharge around incision site)   Complete by:  As directed    Call MD for:  severe uncontrolled pain   Complete by:  As directed    Call MD for:  temperature >100.4   Complete by:  As directed    Diet general   Complete by:  As directed    Increase activity slowly   Complete by:  As directed      Allergies as of 05/15/2018   No Known Allergies     Medication List    TAKE these medications   albuterol 108 (90 Base) MCG/ACT inhaler Commonly known as:  PROVENTIL HFA;VENTOLIN HFA Inhale 1-2 puffs into the lungs every 6 (six) hours as needed for wheezing or shortness of breath.   amoxicillin-clavulanate 875-125 MG tablet Commonly known as:  AUGMENTIN Take 1 tablet by mouth 2 (two) times daily for 7 days.   doxycycline 100 MG tablet Commonly known as:  VIBRA-TABS Take 1 tablet (100 mg total) by mouth 2 (two) times daily for 7 days.   ibuprofen 200 MG tablet Commonly known as:  ADVIL,MOTRIN Take 4 tablets (800 mg total) by mouth every 6 (six) hours as needed for moderate pain.   mupirocin ointment 2 % Commonly  known as:  BACTROBAN Apply to affected area 2 times daily      Follow-up Information    Terance Hart, MD Follow up.   Specialty:  Orthopedic Surgery Why:  We will contact you for follow up appt. Contact information: [address] [place] [address] [phone]        PCP. Schedule an appointment as soon as possible for a visit in 1 week(s).          No Known Allergies  Consultations:  Orthopedics   Procedures/Studies: Dg Elbow Complete Left (3+view)  Result Date:  05/12/2018 CLINICAL DATA:  Left elbow pain. EXAM: LEFT ELBOW - COMPLETE 3+ VIEW COMPARISON:  None. FINDINGS: There is no evidence of fracture, dislocation, or joint effusion. There is no evidence of arthropathy or other focal bone abnormality. Mild diffuse soft tissue swelling noted. No evidence of soft tissue gas or radiopaque foreign body. IMPRESSION: Diffuse soft tissue swelling.  No osseous abnormality. Electronically Signed   By: Myles Rosenthal M.D.   On: 05/12/2018 19:12   Mr Elbow Left W Wo Contrast  Result Date: 05/13/2018 CLINICAL DATA:  Elbow pain and erythema. EXAM: MRI OF THE LEFT ELBOW WITHOUT AND WITH CONTRAST TECHNIQUE: Multiplanar, multisequence MR imaging of the elbow was performed before and after the administration of intravenous contrast. CONTRAST:  7 mL Gadovist COMPARISON:  Left elbow radiographs 05/12/2018 FINDINGS: Markers are placed marking the area of interest. The examination is severely limited in technical quality due to large amount of susceptibility artifact. Diffuse infiltration and edema in the biceps muscle consistent with myositis. There is an intramuscular fluid collection measuring 2.6 x 9.8 cm in axial dimension and measuring at least 6.6 cm longitudinally. The full extent of the lesion is not included within the field of view. Mild enhancement of the surrounding biceps muscle period. TENDONS Limited visualization due to technique. Visualized tendons are not displaced. Biceps tendon is intact. LIGAMENTS Medial stabilizers: Not visualized. Lateral stabilizers:  Not visualized. Cartilage: Not visualized. Joint: Not well visualized.  No significant joint effusion. Cubital tunnel: Not well visualized. Bones: Not well visualized.  No obvious marrow signal changes. IMPRESSION: Abscess demonstrated in the anterior compartment musculature with surrounding muscular edema and myositis. Examination is significantly limited due to motion artifact and susceptibility artifact.  Electronically Signed   By: Burman Nieves M.D.   On: 05/13/2018 06:32      Subjective: Patient seen and examined at bedside.  He feels much better and wants to go home.  No overnight fever, nausea, vomiting or worsening extremity pain.  Discharge Exam: Vitals:   05/14/18 2327 05/15/18 0213  BP: 123/70 117/70  Pulse: 84 81  Resp: 18 18  Temp: 98.1 F (36.7 C) 98.2 F (36.8 C)  SpO2: 100% 99%   Vitals:   05/14/18 1327 05/14/18 1957 05/14/18 2327 05/15/18 0213  BP: 130/78 119/70 123/70 117/70  Pulse: (!) 101 88 84 81  Resp: 18 18 18 18   Temp: 97.7 F (36.5 C) 98 F (36.7 C) 98.1 F (36.7 C) 98.2 F (36.8 C)  TempSrc: Oral Oral Oral Oral  SpO2: 100% 99% 100% 99%  Weight:      Height:        General: Pt is alert, awake, not in acute distress Cardiovascular: rate controlled, S1/S2 + Respiratory: bilateral decreased breath sounds at bases Abdominal: Soft, NT, ND, bowel sounds + Extremities: Left forearm dressing present.    The results of significant diagnostics from this hospitalization (including imaging, microbiology, ancillary  and laboratory) are listed below for reference.     Microbiology: Recent Results (from the past 240 hour(s))  Culture, blood (Routine X 2) w Reflex to ID Panel     Status: None (Preliminary result)   Collection Time: 05/12/18 11:26 AM  Result Value Ref Range Status   Specimen Description BLOOD RIGHT HAND  Final   Special Requests   Final    BOTTLES DRAWN AEROBIC AND ANAEROBIC Blood Culture adequate volume   Culture   Final    NO GROWTH 3 DAYS Performed at [facility], [address] [place], [place] [address]    Report Status PENDING  Incomplete  Culture, blood (Routine X 2) w Reflex to ID Panel     Status: None (Preliminary result)   Collection Time: 05/12/18 11:37 AM  Result Value Ref Range Status   Specimen Description BLOOD LEFT HAND  Final   Special Requests   Final    BOTTLES DRAWN AEROBIC AND ANAEROBIC Blood Culture  adequate volume   Culture   Final    NO GROWTH 3 DAYS Performed at [facility], [address], [place], [place] [address]    Report Status PENDING  Incomplete  Surgical pcr screen     Status: Abnormal   Collection Time: 05/12/18  6:41 PM  Result Value Ref Range Status   MRSA, PCR NEGATIVE NEGATIVE Final   Staphylococcus aureus POSITIVE (A) NEGATIVE Final    Comment: (NOTE) The Xpert SA Assay (FDA approved for NASAL specimens in patients 74 years of age and older), is one component of a comprehensive surveillance program. It is not intended to diagnose infection nor to guide or monitor treatment. Performed at [facility], [address], [place], [place] [address]   Aerobic/Anaerobic Culture (surgical/deep wound)     Status: None (Preliminary result)   Collection Time: 05/13/18  7:55 AM  Result Value Ref Range Status   Specimen Description ABSCESS  Final   Special Requests LEFT ELBOW  Final   Gram Stain   Final    FEW WBC PRESENT, PREDOMINANTLY PMN NO ORGANISMS SEEN Performed at [facility], [address], [place], [place] [address]    Culture FEW SERRATIA MARCESCENS  Final   Report Status PENDING  Incomplete     Labs: BNP (last 3 results) No results for input(s): BNP in the last 8760 hours. Basic Metabolic Panel: Recent Labs  Lab 05/12/18 1126 05/13/18 0948 05/14/18 0249 05/15/18 0117  NA 135 134* 138 141  K 3.9 3.8 3.8 2.8*  CL 100 104 108 108  CO2 25 22 22 24   GLUCOSE 109* 213* 143* 155*  BUN 9 6 11 10   CREATININE 0.53* 0.65 0.57* 0.55*  CALCIUM 9.0 8.1* 8.6* 8.4*  MG  --   --  1.7 1.7   Liver Function Tests: Recent Labs  Lab 05/12/18 1126  AST 23  ALT 37  ALKPHOS 63  BILITOT 1.1  PROT 7.6  ALBUMIN 3.5   No results for input(s): LIPASE, AMYLASE in the last 168 hours. No results for input(s): AMMONIA in the last 168 hours. CBC: Recent Labs  Lab 05/12/18 1126 05/13/18 0948 05/14/18 0249 05/15/18 0117  WBC 21.5* 22.8* 26.2* 11.9*   NEUTROABS 18.8*  --  23.8* 8.9*  HGB 13.4 11.3* 11.2* 10.8*  HCT 42.0 34.6* 34.2* 33.5*  MCV 85.5 85.9 86.4 87.5  PLT 357 299 349 392   Cardiac Enzymes: No results for input(s): CKTOTAL, CKMB, CKMBINDEX, TROPONINI in the last  168 hours. BNP: Invalid input(s): POCBNP CBG: No results for input(s): GLUCAP in the last 168 hours. D-Dimer No results for input(s): DDIMER in the last 72 hours. Hgb A1c No results for input(s): HGBA1C in the last 72 hours. Lipid Profile No results for input(s): CHOL, HDL, LDLCALC, TRIG, CHOLHDL, LDLDIRECT in the last 72 hours. Thyroid function studies No results for input(s): TSH, T4TOTAL, T3FREE, THYROIDAB in the last 72 hours.  Invalid input(s): FREET3 Anemia work up No results for input(s): VITAMINB12, FOLATE, FERRITIN, TIBC, IRON, RETICCTPCT in the last 72 hours. Urinalysis    Component Value Date/Time   COLORURINE STRAW (A) 12/30/2015 0413   APPEARANCEUR CLEAR 12/30/2015 0413   LABSPEC <1.005 (L) 12/30/2015 0413   PHURINE 6.0 12/30/2015 0413   GLUCOSEU NEGATIVE 12/30/2015 0413   HGBUR NEGATIVE 12/30/2015 0413   BILIRUBINUR NEGATIVE 12/30/2015 0413   KETONESUR NEGATIVE 12/30/2015 0413   PROTEINUR NEGATIVE 12/30/2015 0413   NITRITE NEGATIVE 12/30/2015 0413   LEUKOCYTESUR NEGATIVE 12/30/2015 0413   Sepsis Labs Invalid input(s): PROCALCITONIN,  WBC,  LACTICIDVEN Microbiology Recent Results (from the past 240 hour(s))  Culture, blood (Routine X 2) w Reflex to ID Panel     Status: None (Preliminary result)   Collection Time: 05/12/18 11:26 AM  Result Value Ref Range Status   Specimen Description BLOOD RIGHT HAND  Final   Special Requests   Final    BOTTLES DRAWN AEROBIC AND ANAEROBIC Blood Culture adequate volume   Culture   Final    NO GROWTH 3 DAYS Performed at [facility], [address] [place], [place] [address]    Report Status PENDING  Incomplete  Culture, blood (Routine X 2) w Reflex to ID Panel     Status: None (Preliminary  result)   Collection Time: 05/12/18 11:37 AM  Result Value Ref Range Status   Specimen Description BLOOD LEFT HAND  Final   Special Requests   Final    BOTTLES DRAWN AEROBIC AND ANAEROBIC Blood Culture adequate volume   Culture   Final    NO GROWTH 3 DAYS Performed at [facility], [address] [place], [place] [address]    Report Status PENDING  Incomplete  Surgical pcr screen     Status: Abnormal   Collection Time: 05/12/18  6:41 PM  Result Value Ref Range Status   MRSA, PCR NEGATIVE NEGATIVE Final   Staphylococcus aureus POSITIVE (A) NEGATIVE Final    Comment: (NOTE) The Xpert SA Assay (FDA approved for NASAL specimens in patients 29 years of age and older), is one component of a comprehensive surveillance program. It is not intended to diagnose infection nor to guide or monitor treatment. Performed at [facility], [address], [place], [place] [address]   Aerobic/Anaerobic Culture (surgical/deep wound)     Status: None (Preliminary result)   Collection Time: 05/13/18  7:55 AM  Result Value Ref Range Status   Specimen Description ABSCESS  Final   Special Requests LEFT ELBOW  Final   Gram Stain   Final    FEW WBC PRESENT, PREDOMINANTLY PMN NO ORGANISMS SEEN Performed at [facility], [address] [place], [place] [address]    Culture FEW SERRATIA MARCESCENS  Final   Report Status PENDING  Incomplete     Time coordinating discharge: 35 minutes  SIGNED:   Glade Lloyd, MD  [facility] 05/15/2018, 9:25 AM Pager: [phone]  If 7PM-7AM, please contact night-coverage [email or web address] Password [email or web address]

## 2018-05-15 NOTE — Progress Notes (Signed)
Orthopedic Tech Progress Note Patient Details:  Jeffrey Schultz 1981-01-27 [id number]  Ortho Devices Type of Ortho Device: Shoulder immobilizer Ortho Device/Splint Interventions: Application   Post Interventions Patient Tolerated: Well Instructions Provided: Care of device   [name] 05/15/2018, 10:16 AM

## 2018-05-15 NOTE — Progress Notes (Signed)
Subjective: 2 Days Post-Op Procedure(s) (LRB): IRRIGATION AND DEBRIDEMENT ELBOW ABSCESS (Left)  Patient doing well.  He is requesting a sling.  He states his white count has dropped significantly.  His arm is feeling better.  He still has a little bit of pain in his forearm.  He denies any fevers or chills.  Objective: Vital signs in last 24 hours: Temp:  [97.7 F (36.5 C)-98.4 F (36.9 C)] 98.2 F (36.8 C) (10/15 0213) Pulse Rate:  [76-101] 81 (10/15 0213) Resp:  [18] 18 (10/15 0213) BP: (117-130)/(66-78) 117/70 (10/15 0213) SpO2:  [98 %-100 %] 99 % (10/15 0213)   Recent Labs    05/12/18 1126 05/13/18 0948 05/14/18 0249 05/15/18 0117  HGB 13.4 11.3* 11.2* 10.8*   Recent Labs    05/14/18 0249 05/15/18 0117  WBC 26.2* 11.9*  RBC 3.96* 3.83*  HCT 34.2* 33.5*  PLT 349 392    Awake and alert. Left upper semi-dressing removed.  Incision is healing well.  Drain in place.  Redness and induration and pain to palpation of lateral and medial arm diminished.  Some discomfort to palpation of the dorsal forearm but no induration or erythema.  Motor intact median, ulnar, radial nerve distribution.  Silt in median, ulnar, radial nerve dissipation.  Radial pulse palpable.  Microbiology: Serratia marcescens  Assessment/Plan: 2 Days Post-Op Procedure(s) (LRB): IRRIGATION AND DEBRIDEMENT ELBOW ABSCESS (Left)  Patient is doing well status post left elbow I&D.  His white count is dropped significantly.  His pain is improving.  He is okay for discharge if cleared by the medical team.  Home antibiotics per medical team.  He will follow-up with me Monday for wound check and drain removal. I have ordered a sling for the left upper extremity for comfort.    Terance Hart 05/15/2018, 8:42 AM

## 2018-05-17 LAB — CULTURE, BLOOD (ROUTINE X 2)
CULTURE: NO GROWTH
Culture: NO GROWTH
SPECIAL REQUESTS: ADEQUATE
Special Requests: ADEQUATE

## 2018-05-18 LAB — AEROBIC/ANAEROBIC CULTURE W GRAM STAIN (SURGICAL/DEEP WOUND)

## 2018-05-18 LAB — AEROBIC/ANAEROBIC CULTURE (SURGICAL/DEEP WOUND)

## 2020-03-19 ENCOUNTER — Ambulatory Visit: Payer: Self-pay | Attending: Internal Medicine

## 2020-03-19 DIAGNOSIS — Z23 Encounter for immunization: Secondary | ICD-10-CM

## 2020-03-19 NOTE — Progress Notes (Signed)
   Covid-19 Vaccination Clinic  Name:  Jeffrey Schultz    MRN: [id number] DOB: August 18, 1980  03/19/2020  Jeffrey Schultz was observed post Covid-19 immunization for 15 minutes without incident. He was provided with Vaccine Information Sheet and instruction to access the V-Safe system.   Jeffrey Schultz was instructed to call 911 with any severe reactions post vaccine: Marland Kitchen Difficulty breathing  . Swelling of face and throat  . A fast heartbeat  . A bad rash all over body  . Dizziness and weakness   Immunizations Administered    Name Date Dose VIS Date Route   Pfizer COVID-19 Vaccine 03/19/2020  3:08 PM 0.3 mL 09/25/2018 Intramuscular   Manufacturer: [employer], [employer]   Lot: [id number]   NDC: [address]-6

## 2020-04-09 ENCOUNTER — Ambulatory Visit: Payer: Self-pay

## 2020-08-07 IMAGING — MR MR ELBOW*L* WO/W CM
5 of 11 series · 19 of 40 positions shown · IV contrast (agent unspecified)
Comparison: Left elbow radiographs 05/12/2018

CLINICAL DATA: Elbow pain and erythema.

EXAM:
MRI OF THE LEFT ELBOW WITHOUT AND WITH CONTRAST
TECHNIQUE: Multiplanar, multisequence MR imaging of the elbow was performed
before and after the administration of intravenous contrast.
CONTRAST:  7 mL Gadovist

[Series 4: T1 · axial · 4.0mm · 0.23mm/px · z∈[-39,+41]mm · 3 of 28 slices shown]
[im 1/28]
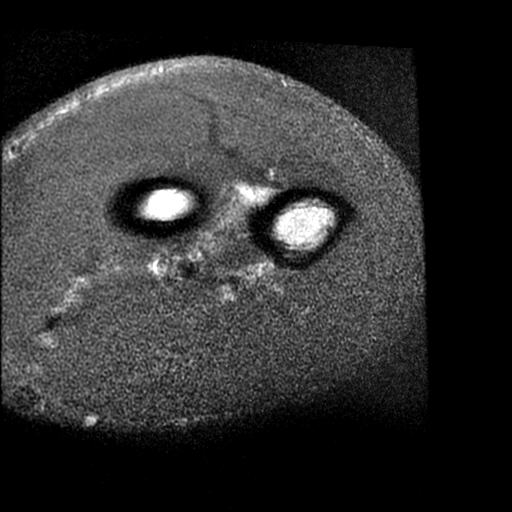
[im 10/28]
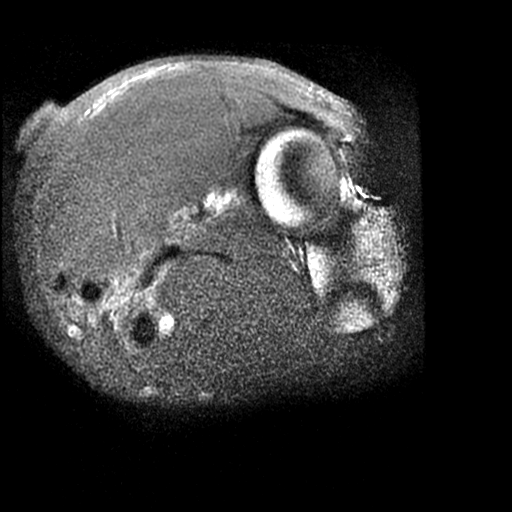
[im 19/28]
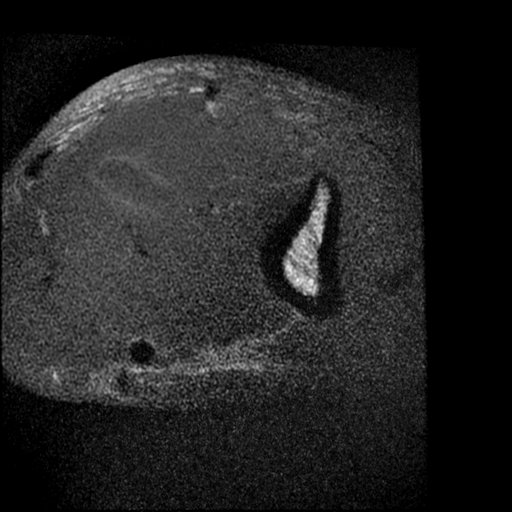

[Series 5: T2 fat-sat · axial · 4.0mm · 0.31mm/px · z∈[-37,+83]mm · 4 of 28 slices shown (1 of 3)]
[im 1/28]
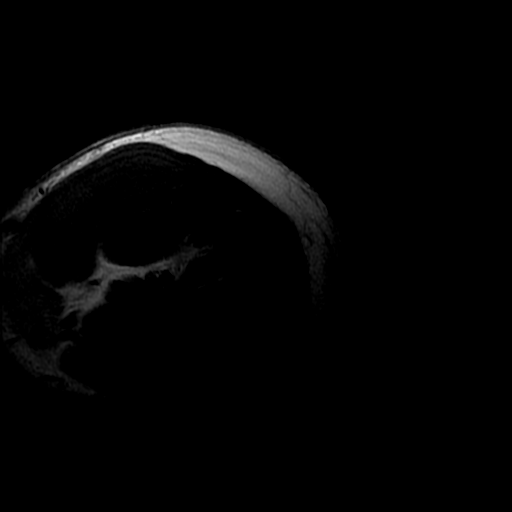
[im 10/28]
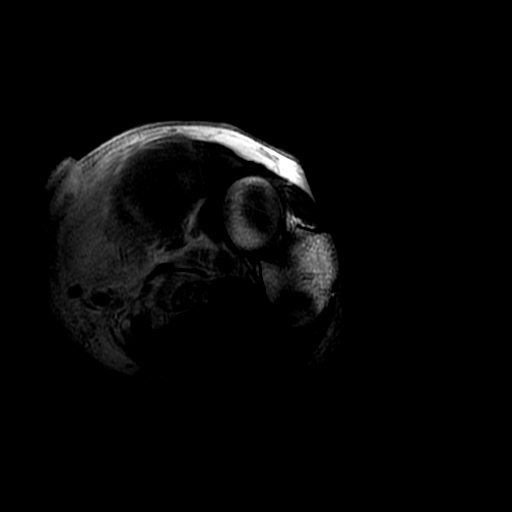
[im 19/28]
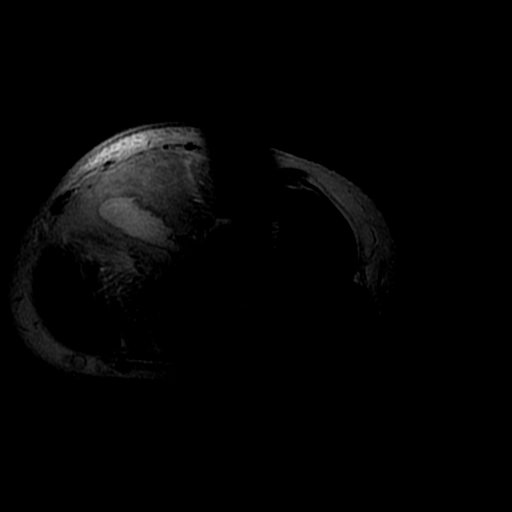
[im 28/28]
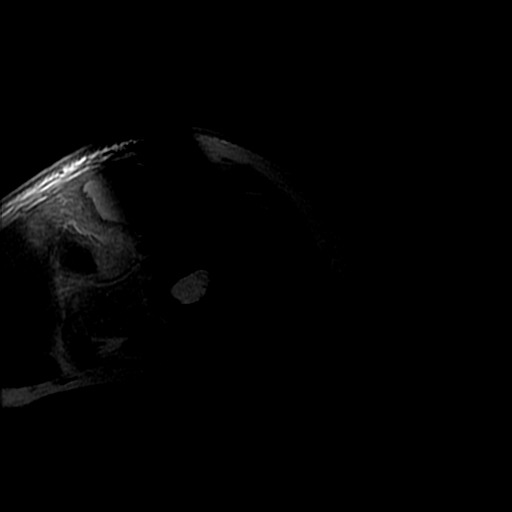

[Series 8: T2 fat-sat · coronal · 3.0mm · 0.31mm/px · 4 of 33 slices shown (2 of 3)]
[im 1/33]
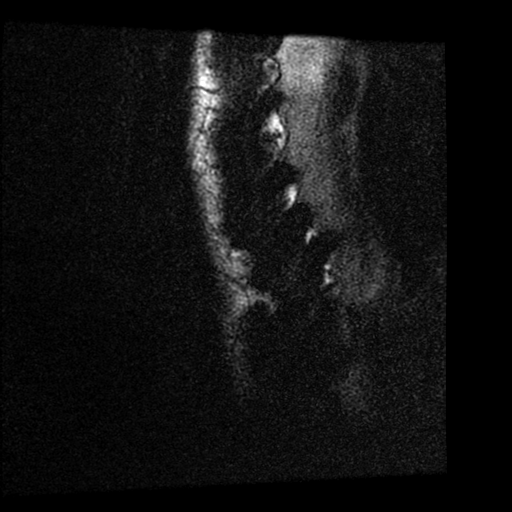
[im 11/33]
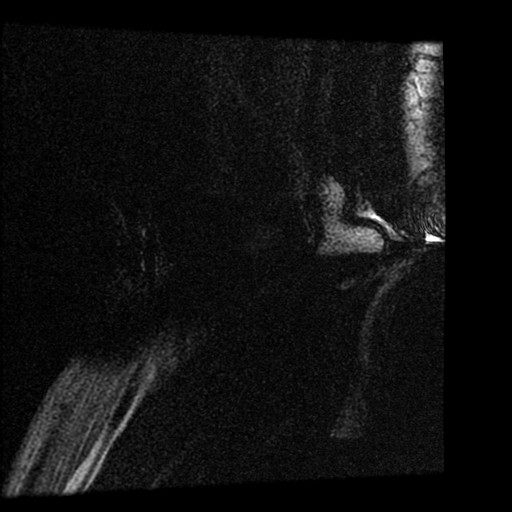
[im 22/33]
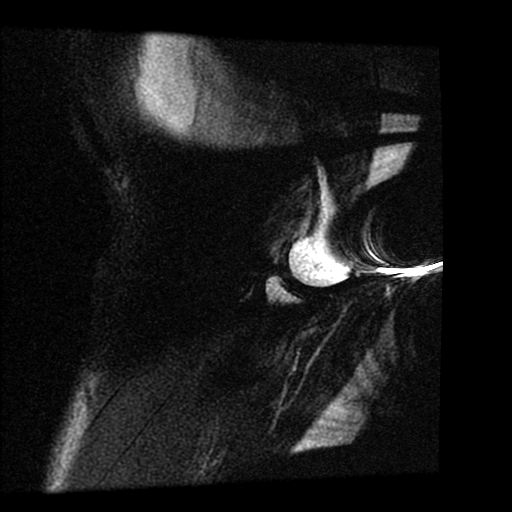
[im 33/33]
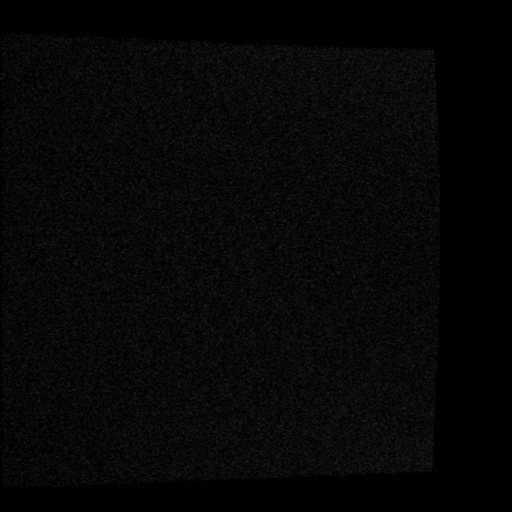

[Series 11: T2 fat-sat · sagittal · 3.0mm · 0.31mm/px · 4 of 34 slices shown (3 of 3)]
[im 1/34]
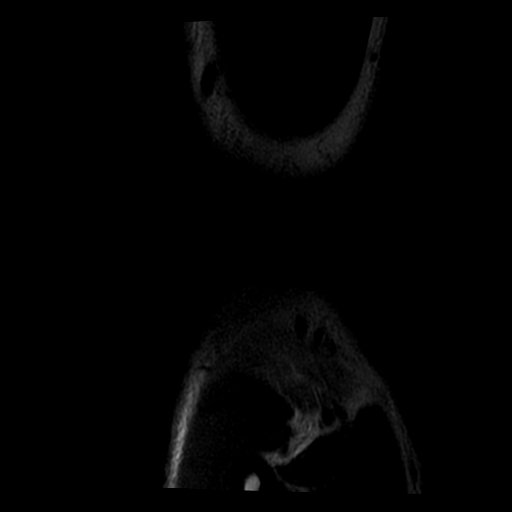
[im 12/34]
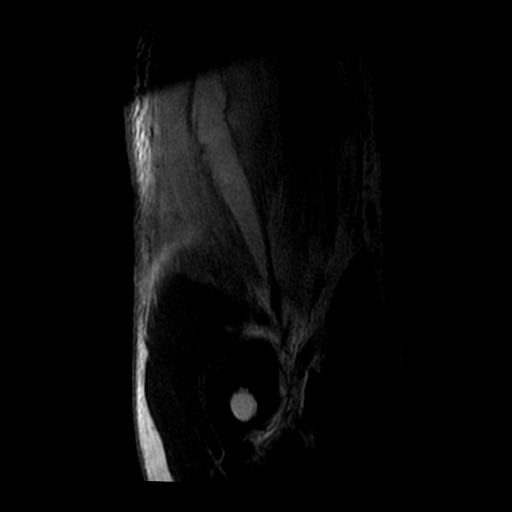
[im 23/34]
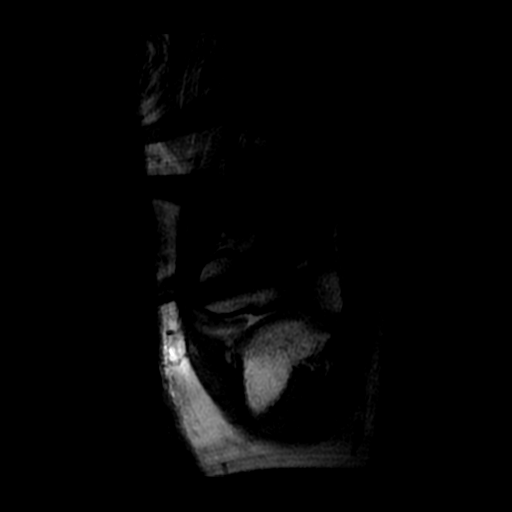
[im 34/34]
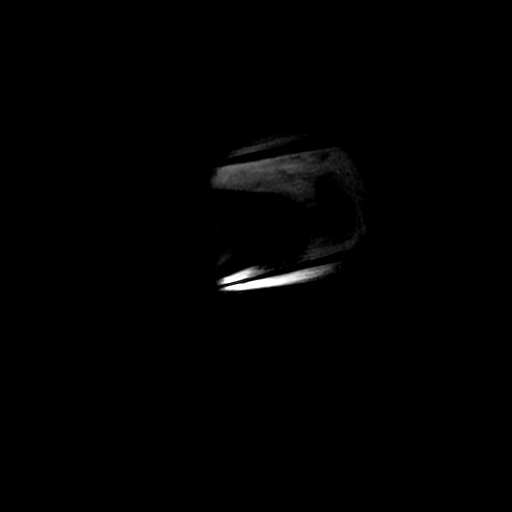

[Series 13: PD fat-sat · sagittal · 3.0mm · 0.31mm/px · 4 of 34 slices shown]
[im 1/34]
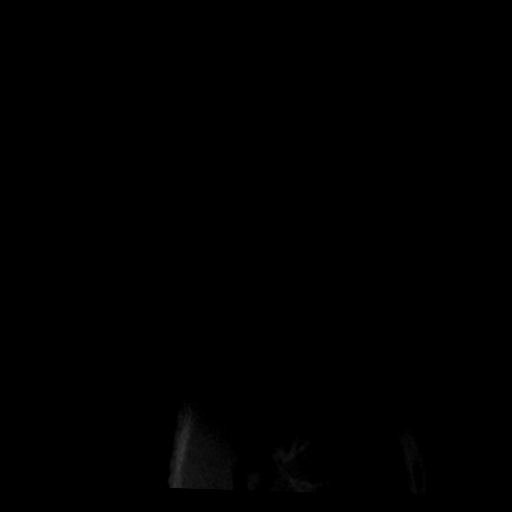
[im 12/34]
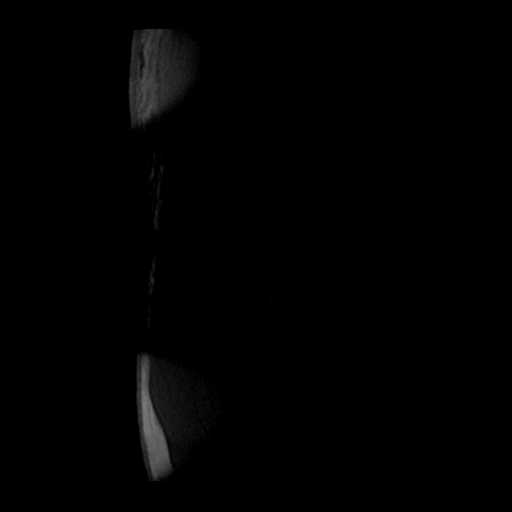
[im 23/34]
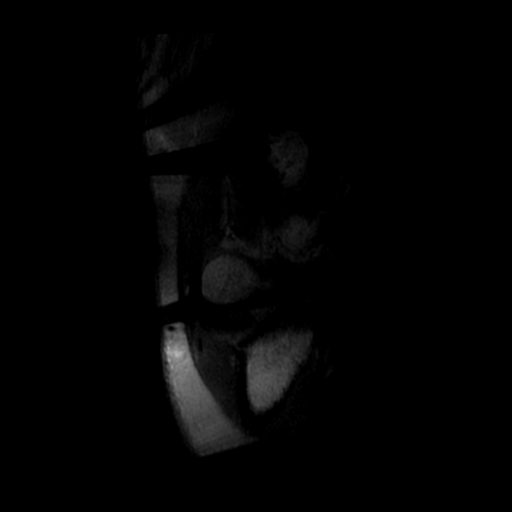
[im 34/34]
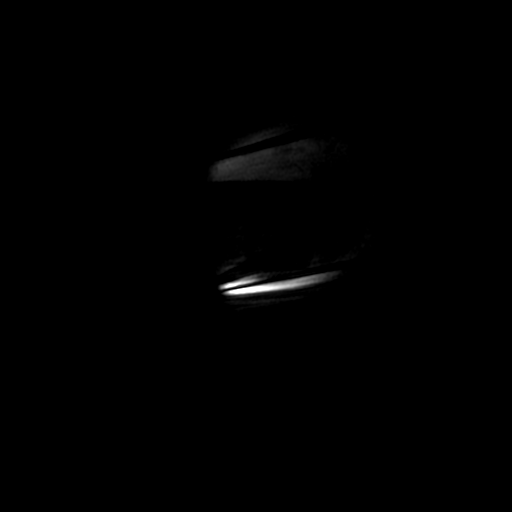

[19 of 40 positions shown; findings below may reference images not displayed]

FINDINGS: Markers are placed marking the area of interest. The examination is
severely limited in technical quality due to large amount of
susceptibility artifact. Diffuse infiltration and edema in the
biceps muscle consistent with myositis. There is an intramuscular
fluid collection measuring 2.6 x 9.8 cm in axial dimension and
measuring at least 6.6 cm longitudinally. The full extent of the
lesion is not included within the field of view. Mild enhancement of
the surrounding biceps muscle period.

TENDONS

Limited visualization due to technique. Visualized tendons are not
displaced. Biceps tendon is intact.

LIGAMENTS

Medial stabilizers: Not visualized.

Lateral stabilizers:  Not visualized.

Cartilage: Not visualized.

Joint: Not well visualized.  No significant joint effusion.

Cubital tunnel: Not well visualized.

Bones: Not well visualized.  No obvious marrow signal changes.
IMPRESSION: Abscess demonstrated in the anterior compartment musculature with
surrounding muscular edema and myositis. Examination is
significantly limited due to motion artifact and susceptibility
artifact.

## 2022-10-12 ENCOUNTER — Encounter: Payer: Self-pay | Admitting: Internal Medicine

## 2022-10-17 ENCOUNTER — Telehealth: Payer: Self-pay

## 2022-10-17 NOTE — Telephone Encounter (Signed)
Mychart msg sent

## 2022-11-12 NOTE — Progress Notes (Unsigned)
Referring Provider: Rebekah Chesterfield, NP Primary Care Physician:  Rebekah Chesterfield, NP Primary Gastroenterologist:  Dr. Jena Gauss  Chief Complaint  Patient presents with   Gastroesophageal Reflux    Heart burn really bad also thinks he tested positive for Hep A or Hep B not sure which one.     HPI:   Jeffrey Schultz is a 42 y.o. male presenting today at the request of Rebekah Chesterfield, NP for GERD and hepatitis.  Reviewed labs available in referral. 09/15/2022: WBC, hemoglobin, platelets wnl. LFTs elevated with AST 68, ALT 112, alk phos and bilirubin wnl.  09/01/2022: AST 73 (H), ALT 123 (H), alk phos and total bilirubin within normal limits. In 2019, LFTs were within normal limits.  Today:  Reports LFTs have been elevated for about 3 years or so.  No regular  Previously used heroin. Last used 18 months ago.  No OTC supplements. No herbal teas.  Aleve occasionally for a HA. Maybe once a week.  Has tattoos.    Having heartburn. Started for the last 18 months or so. Started when he stopped drugs. No nausea or vomiting. No abdominal pain, brbpr, melena. No dysphagia. No unintentional weight loss. Taking 2 Prilosec OTC daily. There may be 1-2 days he doesn't have to take.   Cut back on soda. Mostly drinking water. Spicy is worse. Not much fried foods. Drinks starbucks daily.   Thinks dad had alcoholic cirrhosis. Thinks it was secondary to cancer.   Past Medical History:  Diagnosis Date   Anxiety    Depression    Drug overdose    Substance abuse     Past Surgical History:  Procedure Laterality Date   I & D EXTREMITY Left 05/13/2018   Procedure: IRRIGATION AND DEBRIDEMENT ELBOW ABSCESS;  Surgeon: Terance Hart, MD;  Location: [facility] OR;  Service: Orthopedics;  Laterality: Left;    Current Outpatient Medications  Medication Sig Dispense Refill   methadone (DOLOPHINE) 10 MG/ML solution Take 120 mg by mouth daily.     omeprazole (PRILOSEC) 40 MG capsule Take 1 capsule  (40 mg total) by mouth daily before breakfast. 30 capsule 5   ibuprofen (ADVIL,MOTRIN) 200 MG tablet Take 4 tablets (800 mg total) by mouth every 6 (six) hours as needed for moderate pain. (Patient not taking: Reported on 11/14/2022) 30 tablet 0   mupirocin ointment (BACTROBAN) 2 % Apply to affected area 2 times daily (Patient not taking: Reported on 11/14/2022) 22 g 0   No current facility-administered medications for this visit.    Allergies as of 11/14/2022   (No Known Allergies)    Family History  Problem Relation Age of Onset   Cirrhosis Father    Liver cancer Father    Colon cancer Neg Hx     Social History   Socioeconomic History   Marital status: Single    Spouse name: Not on file   Number of children: Not on file   Years of education: Not on file   Highest education level: Not on file  Occupational History   Not on file  Tobacco Use   Smoking status: Every Day    Packs/day: 1    Types: Cigarettes   Smokeless tobacco: Never  Vaping Use   Vaping Use: Never used  Substance and Sexual Activity   Alcohol use: No   Drug use: Yes    Types: Heroin, Benzodiazepines, Marijuana, IV    Comment: heroin   Sexual activity: Not on file  Other  Topics Concern   Not on file  Social History Narrative   Not on file   Social Determinants of Health   Financial Resource Strain: Not on file  Food Insecurity: Not on file  Transportation Needs: Not on file  Physical Activity: Not on file  Stress: Not on file  Social Connections: Not on file  Intimate Partner Violence: Not on file    Review of Systems: Gen: Denies any fever, chills, cold or flulike symptoms, presyncope, syncope. CV: Denies chest pain, heart palpitations. Resp: Denies shortness of breath, cough. GI: See HPI GU : Denies urinary burning, urinary frequency, urinary hesitancy MS: Denies joint pain. Derm: Denies rash. Psych: Denies depression, anxiety. Heme: See HPI  Physical Exam: BP 125/79 (BP Location:  Right Arm, Patient Position: Sitting, Cuff Size: Normal)   Pulse 89   Temp 98.2 F (36.8 C) (Temporal)   Ht 5\' 8"  (1.727 m)   Wt 194 lb 6.4 oz (88.2 kg)   SpO2 94%   BMI 29.56 kg/m  General:   Alert and oriented. Pleasant and cooperative. Well-nourished and well-developed.  Head:  Normocephalic and atraumatic. Eyes:  Without icterus, sclera clear and conjunctiva pink.  Ears:  Normal auditory acuity. Lungs:  Clear to auscultation bilaterally. No wheezes, rales, or rhonchi. No distress.  Heart:  S1, S2 present without murmurs appreciated.  Abdomen:  +BS, soft, non-tender and non-distended. No HSM noted. No guarding or rebound. No masses appreciated.  Rectal:  Deferred  Msk:  Symmetrical without gross deformities. Normal posture. Extremities:  Without edema. Neurologic:  Alert and  oriented x4;  grossly normal neurologically. Skin:  Intact without significant lesions or rashes. Psych:  Normal mood and affect.    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  Elevated LFTs: Patient reports LFTs have been elevated for a few years.  Most recent labs 09/15/2022 with AST 68, ALT 112, alk phos and bilirubin within normal limits.  Platelets within normal limits.  He is at risk for hepatitis B and C in the setting of IV drug use and tattoos.  No significant history of alcohol use, over-the-counter supplements, herbal teas, or Tylenol use.  No signs or symptoms of decompensated liver disease.  Will plan to screen him for viral hepatitis, hemochromatosis, check INR, and abdominal ultrasound.  GERD: Present for at least 2 years, currently taking 40 mg of over-the-counter Prilosec which controls his symptoms well.  May go 1-2 days without needing medication.  He has made some dietary changes, but continues with symptoms.  No alarm symptoms.  Will start him on omeprazole  40 mg every day.  Reinforced GERD diet/lifestyle.   Plan:  Hepatitis A antibody total, hepatitis B surface antibody, updated B surface antigen, hepatitis B core antibody total, hepatitis C antibody, iron panel with ferritin, INR, HFP. Ultrasound abdomen complete Start omeprazole 40 mg daily 30 minutes before breakfast. Counseled on GERD diet/lifestyle.  Separate instructions provided on AVS. Follow-up in 3 months or sooner if needed.   Ermalinda Memos, PA-C [facility] 11/14/2022

## 2022-11-14 ENCOUNTER — Ambulatory Visit (INDEPENDENT_AMBULATORY_CARE_PROVIDER_SITE_OTHER): Payer: Medicaid Other | Admitting: Gastroenterology

## 2022-11-14 ENCOUNTER — Encounter: Payer: Self-pay | Admitting: Gastroenterology

## 2022-11-14 ENCOUNTER — Telehealth: Payer: Self-pay | Admitting: *Deleted

## 2022-11-14 VITALS — BP 125/79 | HR 89 | Temp 98.2°F | Ht 68.0 in | Wt 194.4 lb

## 2022-11-14 DIAGNOSIS — R7989 Other specified abnormal findings of blood chemistry: Secondary | ICD-10-CM | POA: Diagnosis not present

## 2022-11-14 DIAGNOSIS — K219 Gastro-esophageal reflux disease without esophagitis: Secondary | ICD-10-CM

## 2022-11-14 MED ORDER — OMEPRAZOLE 40 MG PO CPDR: 40.0000 mg | DELAYED_RELEASE_CAPSULE | Freq: Every day | ORAL | 5 refills | Status: DC

## 2022-11-14 NOTE — Telephone Encounter (Signed)
Pt states he got voicemail about upcoming [place].

## 2022-11-14 NOTE — Telephone Encounter (Signed)
LM on vm with [place] appointment date, time and instructions. Asked for pt to return call to confirm appointment.

## 2022-11-14 NOTE — Patient Instructions (Signed)
Please have blood work completed at [facility].  We will arrange to have an ultrasound of your abdomen at [facility].  Start omeprazole 40 mg daily 30 minutes before breakfast for reflux/heartburn.  Follow a GERD diet/lifestyle:  Avoid fried, fatty, greasy, spicy, citrus foods. Avoid caffeine and carbonated beverages. Avoid chocolate. Try eating 4-6 small meals a day rather than 3 large meals. Do not eat within 3 hours of laying down. Prop head of bed up on wood or bricks to create a 6 inch incline.   Will plan to follow-up in 3 months or sooner if needed.  My nurse will call you with your lab and ultrasound results with further recommendations at that time.   It was very nice to meet you today!  Ermalinda Memos, PA-C [facility]

## 2022-11-15 ENCOUNTER — Encounter: Payer: Self-pay | Admitting: Gastroenterology

## 2022-11-24 ENCOUNTER — Ambulatory Visit (HOSPITAL_COMMUNITY): Payer: Medicaid Other | Attending: Gastroenterology

## 2022-12-13 NOTE — Telephone Encounter (Signed)
Pt 's wife left vm stating that pt missed his ultrasound on 11/24/22 due to having Covid. Needed to reschedule   LMOVM of pt's wife Toni Amend (on [place]) of appt date and time of [place]  [place] scheduled for 12/29/22, arrive at 8:15 am to check in, NPO after midnight. Any questions, to call back.

## 2022-12-29 ENCOUNTER — Ambulatory Visit (HOSPITAL_COMMUNITY): Payer: Medicaid Other | Attending: Gastroenterology

## 2023-01-04 ENCOUNTER — Encounter: Payer: Self-pay | Admitting: Gastroenterology

## 2023-01-10 NOTE — Telephone Encounter (Signed)
Pt missed his ultrasound on 12/29/22 due to being sick. Pt has been rescheduled to 01/24/23, arrive at 7:15 am, NPO after midnight. Pt informed.

## 2023-01-24 ENCOUNTER — Ambulatory Visit (HOSPITAL_COMMUNITY): Admission: RE | Admit: 2023-01-24 | Payer: Medicaid Other | Source: Ambulatory Visit

## 2023-05-10 ENCOUNTER — Telehealth: Payer: Self-pay | Admitting: Gastroenterology

## 2023-05-10 ENCOUNTER — Encounter: Payer: Self-pay | Admitting: *Deleted

## 2023-05-10 DIAGNOSIS — R7989 Other specified abnormal findings of blood chemistry: Secondary | ICD-10-CM

## 2023-05-10 NOTE — Telephone Encounter (Signed)
LMOM for pt to call office. Sent MyChart message also.

## 2023-05-10 NOTE — Telephone Encounter (Signed)
Tammy/Mindy  Can you get patient's abdominal US rescheduled?

## 2023-05-10 NOTE — Telephone Encounter (Signed)
Patient left a message asking if he could have blood work/lab work rescheduled.

## 2023-05-11 ENCOUNTER — Other Ambulatory Visit: Payer: Self-pay | Admitting: *Deleted

## 2023-05-22 ENCOUNTER — Ambulatory Visit (HOSPITAL_COMMUNITY)
Admission: RE | Admit: 2023-05-22 | Discharge: 2023-05-22 | Disposition: A | Discharge status: Home / Self Care | Payer: MEDICAID | Source: Ambulatory Visit | Attending: Gastroenterology | Admitting: Gastroenterology

## 2023-05-22 DIAGNOSIS — R7989 Other specified abnormal findings of blood chemistry: Secondary | ICD-10-CM | POA: Insufficient documentation

## 2023-12-13 ENCOUNTER — Emergency Department (HOSPITAL_BASED_OUTPATIENT_CLINIC_OR_DEPARTMENT_OTHER)
Admission: EM | Admit: 2023-12-13 | Discharge: 2023-12-13 | Disposition: A | Discharge status: Home / Self Care | Payer: MEDICAID | Attending: Emergency Medicine | Admitting: Emergency Medicine

## 2023-12-13 ENCOUNTER — Encounter (HOSPITAL_BASED_OUTPATIENT_CLINIC_OR_DEPARTMENT_OTHER): Payer: Self-pay | Admitting: Emergency Medicine

## 2023-12-13 ENCOUNTER — Emergency Department (HOSPITAL_BASED_OUTPATIENT_CLINIC_OR_DEPARTMENT_OTHER): Payer: MEDICAID | Admitting: Radiology

## 2023-12-13 ENCOUNTER — Other Ambulatory Visit: Payer: Self-pay

## 2023-12-13 DIAGNOSIS — I10 Essential (primary) hypertension: Secondary | ICD-10-CM | POA: Insufficient documentation

## 2023-12-13 DIAGNOSIS — R0789 Other chest pain: Secondary | ICD-10-CM | POA: Diagnosis not present

## 2023-12-13 DIAGNOSIS — R059 Cough, unspecified: Secondary | ICD-10-CM | POA: Diagnosis present

## 2023-12-13 DIAGNOSIS — F17210 Nicotine dependence, cigarettes, uncomplicated: Secondary | ICD-10-CM | POA: Diagnosis not present

## 2023-12-13 DIAGNOSIS — R0781 Pleurodynia: Secondary | ICD-10-CM | POA: Insufficient documentation

## 2023-12-13 MED ORDER — CYCLOBENZAPRINE HCL 10 MG PO TABS
10.0000 mg | ORAL_TABLET | Freq: Once | ORAL | Status: AC
  Administered 2023-12-13: 10 mg via ORAL
  Filled 2023-12-13: qty 1

## 2023-12-13 MED ORDER — CYCLOBENZAPRINE HCL 10 MG PO TABS: 10.0000 mg | ORAL_TABLET | Freq: Two times a day (BID) | ORAL | 0 refills | Status: DC | PRN

## 2023-12-13 MED ORDER — LIDOCAINE 5 % EX PTCH
1.0000 | MEDICATED_PATCH | CUTANEOUS | Status: DC
  Administered 2023-12-13: 1 via TRANSDERMAL
  Filled 2023-12-13: qty 1

## 2023-12-13 MED ORDER — LIDOCAINE 5 % EX PTCH: 1.0000 | MEDICATED_PATCH | CUTANEOUS | 0 refills | Status: AC

## 2023-12-13 MED ORDER — KETOROLAC TROMETHAMINE 30 MG/ML IJ SOLN
30.0000 mg | Freq: Once | INTRAMUSCULAR | Status: AC
  Administered 2023-12-13: 30 mg via INTRAMUSCULAR
  Filled 2023-12-13: qty 1

## 2023-12-13 MED ORDER — MELOXICAM 15 MG PO TABS: 15.0000 mg | ORAL_TABLET | Freq: Every day | ORAL | 0 refills | Status: DC | PRN

## 2023-12-13 NOTE — ED Triage Notes (Signed)
 Right rib pain several days. Chronic cough- smokes. Felt pop on right side and has been much worse since-occurred today. Painful deep breathing.

## 2023-12-13 NOTE — Discharge Instructions (Signed)
 As discussed, your workup today was overall reassuring.  Chest x-ray did not show obvious fracture, dislocation, collapsed lung or other abnormality.  Suspect right-sided rib/muscular pain as cause of your symptoms.  Will send you home with anti-inflammatory, muscle laxer and numbing patches to use as needed. Note that the muscle laxer can cause drowsiness so please do not drive or perform any high-risk activity until you realize its effects on you.  Recommend follow-up in the primary care for reassessment of your symptoms.  Please do not hesitate to return to the emergency department if the worrisome signs and symptoms we discussed become apparent.

## 2023-12-13 NOTE — ED Provider Notes (Signed)
 [place] EMERGENCY DEPARTMENT AT [facility] Provider Note   CSN: [id number] Arrival date & time: 12/13/23  1638     History  Chief Complaint  Patient presents with   Cough    Rib pain     Jeffrey Schultz is a 43 y.o. male.   Cough   43 year old male presents emergency department with complaints of right-sided rib pain.  States that he was working on a car 3 days ago.  States that he noticed a soreness upon awakening on his right side chest wall.  States that he has been managing his symptoms at home with rest.  States that he had a couple episodes of coughing earlier today and felt a popping sensation in his right rib causing worsening pain.  Has taken no medication for his symptoms.  Denies any shortness of breath, abdominal pain, nausea, vomiting.  Past medical history significant for polysubstance use, GERD  Home Medications Prior to Admission medications   Medication Sig Start Date End Date Taking? Authorizing Provider  ibuprofen  (ADVIL ,MOTRIN ) 200 MG tablet Take 4 tablets (800 mg total) by mouth every 6 (six) hours as needed for moderate pain. Patient not taking: Reported on 11/14/2022 05/15/18   Audria Leather, MD  methadone (DOLOPHINE) 10 MG/ML solution Take 120 mg by mouth daily.    [provider]  mupirocin  ointment (BACTROBAN ) 2 % Apply to affected area 2 times daily Patient not taking: Reported on 11/14/2022 05/15/18   Audria Leather, MD  omeprazole  (PRILOSEC) 40 MG capsule Take 1 capsule (40 mg total) by mouth daily before breakfast. 11/14/22   Evander Hills, PA-C      Allergies    Patient has no known allergies.    Review of Systems   Review of Systems  Respiratory:  Positive for cough.   All other systems reviewed and are negative.   Physical Exam Updated Vital Signs BP (!) 146/97 (BP Location: Right Arm)   Pulse 84   Temp 98 F (36.7 C)   Resp 16   SpO2 95%  Physical Exam Vitals and nursing note reviewed.  Constitutional:       General: He is not in acute distress.    Appearance: He is well-developed.  HENT:     Head: Normocephalic and atraumatic.  Eyes:     Conjunctiva/sclera: Conjunctivae normal.  Cardiovascular:     Rate and Rhythm: Normal rate and regular rhythm.     Heart sounds: No murmur heard. Pulmonary:     Effort: Pulmonary effort is normal. No respiratory distress.     Breath sounds: Normal breath sounds. No wheezing, rhonchi or rales.     Comments: Right-sided chest wall tenderness.  No obvious flail chest.  No palpable crepitus/deformity. Chest:     Chest wall: Tenderness present.  Abdominal:     Palpations: Abdomen is soft.     Tenderness: There is no abdominal tenderness.  Musculoskeletal:        General: No swelling.     Cervical back: Neck supple.  Skin:    General: Skin is warm and dry.     Capillary Refill: Capillary refill takes less than 2 seconds.  Neurological:     Mental Status: He is alert.  Psychiatric:        Mood and Affect: Mood normal.     ED Results / Procedures / Treatments   Labs (all labs ordered are listed, but only abnormal results are displayed) Labs Reviewed - No data to display  EKG  None  Radiology DG Ribs Unilateral W/Chest Right Result Date: 12/13/2023 CLINICAL DATA:  Right chest wall pain. EXAM: RIGHT RIBS AND CHEST - 3+ VIEW COMPARISON:  Chest radiograph dated 01/18/2017. FINDINGS: No focal consolidation, pleural effusion or pneumothorax. The cardiac silhouette is within normal limits. No acute osseous pathology. No displaced rib fractures. IMPRESSION: 1. No active cardiopulmonary disease. 2. No displaced rib fractures. Electronically Signed   By: Angus Bark M.D.   On: 12/13/2023 17:25    Procedures Procedures    Medications Ordered in ED Medications - No data to display  ED Course/ Medical Decision Making/ A&P                                 Medical Decision Making Amount and/or Complexity of Data Reviewed Radiology:  ordered.  Risk Prescription drug management.   This patient presents to the ED for concern of chest wall pain, this involves an extensive number of treatment options, and is a complaint that carries with it a high risk of complications and morbidity.  The differential diagnosis includes fracture, strain/sprain, pneumothorax, hemothorax, other   Co morbidities that complicate the patient evaluation  See HPI   Additional history obtained:  Additional history obtained from EMR External records from outside source obtained and reviewed including hospital records   Lab Tests:  N/a   Imaging Studies ordered:  I ordered imaging studies including Chest x-ray w/ right ribs  I independently visualized and interpreted imaging which showed no acute abnormality I agree with the radiologist interpretation   Cardiac Monitoring: / EKG:  N/a   Consultations Obtained:  N/a   Problem List / ED Course / Critical interventions / Medication management  Chest wall pain I ordered medication including toradol , lidoderm , flexeirl   Reevaluation of the patient after these medicines showed that the patient improved I have reviewed the patients home medicines and have made adjustments as needed   Social Determinants of Health:  Chronic cigarette use.  History of substance use   Test / Admission - Considered:  Chest wall pain Vitals signs significant for hypertension blood pressure 146/97. Otherwise within normal range and stable throughout visit. Imaging studies significant for: See above 43 year old male presents emergency department with complaints of right-sided rib pain.  States that he was working on a car 3 days ago.  States that he noticed a soreness upon awakening on his right side chest wall.  States that he has been managing his symptoms at home with rest.  States that he had a couple episodes of coughing earlier today and felt a popping sensation in his right rib causing  worsening pain.  Has taken no medication for his symptoms.  Denies any shortness of breath, abdominal pain, nausea, vomiting. On exam, lungs clear to station bilaterally.  Right lateral chest wall tenderness.  No abdominal tenderness.  Chest x-ray performed in triage staff is negative for any acute abnormality.  Suspect musculoskeletal etiology of patient's symptoms.  Will send patient home with incentive spirometry.  Will recommend symptomatic therapy as described in AVS and close follow-up with PCP in the outpatient setting.  Treatment plan discussed with patient and he acknowledged understanding was agreeable to said plan.  Patient overall well-appearing, afebrile in no acute distress. Worrisome signs and symptoms were discussed with the patient, and the patient acknowledged understanding to return to the ED if noticed. Patient was stable upon discharge.  Final Clinical Impression(s) / ED Diagnoses Final diagnoses:  None    Rx / DC Orders ED Discharge Orders     None         Elburn Butter, [place] 12/13/23 1753    Guadalupe Lee, MD 12/19/23 1459

## 2024-06-19 ENCOUNTER — Encounter (HOSPITAL_BASED_OUTPATIENT_CLINIC_OR_DEPARTMENT_OTHER): Payer: Self-pay | Admitting: Emergency Medicine

## 2024-06-19 ENCOUNTER — Emergency Department (HOSPITAL_BASED_OUTPATIENT_CLINIC_OR_DEPARTMENT_OTHER)
Admission: EM | Admit: 2024-06-19 | Discharge: 2024-06-19 | Disposition: A | Discharge status: Home / Self Care | Payer: MEDICAID

## 2024-06-19 ENCOUNTER — Telehealth: Payer: Self-pay | Admitting: Pulmonary Disease

## 2024-06-19 ENCOUNTER — Emergency Department (HOSPITAL_BASED_OUTPATIENT_CLINIC_OR_DEPARTMENT_OTHER): Payer: MEDICAID

## 2024-06-19 ENCOUNTER — Emergency Department (HOSPITAL_BASED_OUTPATIENT_CLINIC_OR_DEPARTMENT_OTHER): Payer: MEDICAID | Admitting: Radiology

## 2024-06-19 ENCOUNTER — Other Ambulatory Visit (HOSPITAL_BASED_OUTPATIENT_CLINIC_OR_DEPARTMENT_OTHER): Payer: Self-pay

## 2024-06-19 ENCOUNTER — Other Ambulatory Visit: Payer: Self-pay

## 2024-06-19 DIAGNOSIS — R911 Solitary pulmonary nodule: Secondary | ICD-10-CM | POA: Diagnosis not present

## 2024-06-19 DIAGNOSIS — F17210 Nicotine dependence, cigarettes, uncomplicated: Secondary | ICD-10-CM | POA: Diagnosis not present

## 2024-06-19 DIAGNOSIS — R042 Hemoptysis: Secondary | ICD-10-CM | POA: Insufficient documentation

## 2024-06-19 DIAGNOSIS — J984 Other disorders of lung: Secondary | ICD-10-CM

## 2024-06-19 LAB — BASIC METABOLIC PANEL WITH GFR
Anion gap: 9 (ref 5–15)
BUN: 15 mg/dL (ref 6–20)
CO2: 29 mmol/L (ref 22–32)
Calcium: 10 mg/dL (ref 8.9–10.3)
Chloride: 100 mmol/L (ref 98–111)
Creatinine, Ser: 0.77 mg/dL (ref 0.61–1.24)
GFR, Estimated: >60 mL/min (ref >60)
Glucose, Bld: 105 mg/dL — ABNORMAL HIGH (ref 70–99)
Potassium: 4.3 mmol/L (ref 3.5–5.1)
Sodium: 138 mmol/L (ref 135–145)

## 2024-06-19 LAB — HEPATIC FUNCTION PANEL
ALT: 142 U/L — ABNORMAL HIGH (ref 0–44)
AST: 71 U/L — ABNORMAL HIGH (ref 15–41)
Albumin: 4.2 g/dL (ref 3.5–5.0)
Alkaline Phosphatase: 88 U/L (ref 38–126)
Bilirubin, Direct: <0.1 mg/dL (ref 0.0–0.2)
Total Bilirubin: 0.3 mg/dL (ref 0.0–1.2)
Total Protein: 7.5 g/dL (ref 6.5–8.1)

## 2024-06-19 LAB — CBC
HCT: 43.9 % (ref 39.0–52.0)
Hemoglobin: 14.3 g/dL (ref 13.0–17.0)
MCH: 27.3 pg (ref 26.0–34.0)
MCHC: 32.6 g/dL (ref 30.0–36.0)
MCV: 83.9 fL (ref 80.0–100.0)
Platelets: 275 K/uL (ref 150–400)
RBC: 5.23 MIL/uL (ref 4.22–5.81)
RDW: 14.2 % (ref 11.5–15.5)
WBC: 7.5 K/uL (ref 4.0–10.5)
nRBC: 0 % (ref 0.0–0.2)

## 2024-06-19 LAB — URINE DRUG SCREEN
Amphetamines: NEGATIVE
Barbiturates: NEGATIVE
Benzodiazepines: NEGATIVE
Cocaine: NEGATIVE
Fentanyl: NEGATIVE
Methadone Scn, Ur: POSITIVE — AB
Opiates: NEGATIVE
Tetrahydrocannabinol: NEGATIVE

## 2024-06-19 LAB — LACTIC ACID, PLASMA: Lactic Acid, Venous: 0.6 mmol/L (ref 0.5–1.9)

## 2024-06-19 MED ORDER — AMOXICILLIN-POT CLAVULANATE 875-125 MG PO TABS
1.0000 | ORAL_TABLET | Freq: Two times a day (BID) | ORAL | 0 refills | Status: AC
  Filled 2024-06-19: qty 14, 7d supply, fill #0

## 2024-06-19 MED ORDER — NICOTINE 14 MG/24HR TD PT24
14.0000 mg | MEDICATED_PATCH | Freq: Every day | TRANSDERMAL | 0 refills | Status: DC
  Filled 2024-06-19: qty 28, 28d supply, fill #0

## 2024-06-19 MED ORDER — AMOXICILLIN-POT CLAVULANATE 875-125 MG PO TABS
1.0000 | ORAL_TABLET | Freq: Two times a day (BID) | ORAL | 0 refills | Status: DC
  Filled 2024-06-19: qty 14, 7d supply, fill #0

## 2024-06-19 MED ORDER — IOHEXOL 350 MG/ML SOLN
100.0000 mL | Freq: Once | INTRAVENOUS | Status: AC | PRN
  Administered 2024-06-19: 75 mL via INTRAVENOUS

## 2024-06-19 NOTE — ED Triage Notes (Signed)
 Pt via pov from home with hemoptysis today; states he had some last week, but that this was more so than then. Pt reports right sided ribcage/abdominal pain. Pt smokes menthol cigarettes. Lungs clear upon auscultation. Pt a&o x 4; nad noted.

## 2024-06-19 NOTE — ED Provider Notes (Signed)
  EMERGENCY DEPARTMENT AT [facility] Provider Note   CSN: [id number] Arrival date & time: 06/19/24  1159     Patient presents with: Hemoptysis and Abdominal Pain   Jeffrey Schultz is a 43 y.o. male patient with past medical history of IVDU, reports has not used IV drugs in 3 years, tobacco use reporting with hemoptysis.  Reports he has had 2 episodes of this.  He had 1 episode approximately 7 days ago and another episode today.  He reports that when he starts coughing in the morning he has brought up bright red blood that slowly seems to thin out into just mucous. He reports mild stable SOB. No CP, no worsening cough, no fever. No rash. Smokes 10 years >1ppd.     Abdominal Pain Associated symptoms: cough        Prior to Admission medications   Medication Sig Start Date End Date Taking? Authorizing Provider  amoxicillin -clavulanate (AUGMENTIN ) 875-125 MG tablet Take 1 tablet by mouth every 12 (twelve) hours. 06/19/24  Yes Deldrick Linch N, PA-C  amoxicillin -clavulanate (AUGMENTIN ) 875-125 MG tablet Take 1 tablet by mouth every 12 (twelve) hours. 06/19/24  Yes Dontea Corlew N, PA-C  nicotine  (NICODERM CQ  - DOSED IN MG/24 HOURS) 14 mg/24hr patch Place 1 patch (14 mg total) onto the skin daily. 06/19/24  Yes Ashlley Booher, Warren SAILOR, PA-C  cyclobenzaprine  (FLEXERIL ) 10 MG tablet Take 1 tablet (10 mg total) by mouth 2 (two) times daily as needed for muscle spasms. 12/13/23   Silver Fell A, PA  ibuprofen  (ADVIL ,MOTRIN ) 200 MG tablet Take 4 tablets (800 mg total) by mouth every 6 (six) hours as needed for moderate pain. Patient not taking: Reported on 11/14/2022 05/15/18   Cheryle Page, MD  lidocaine  (LIDODERM ) 5 % Place 1 patch onto the skin daily. Remove & Discard patch within 12 hours or as directed by MD 12/13/23   Silver Fell LABOR, PA  meloxicam  (MOBIC ) 15 MG tablet Take 1 tablet (15 mg total) by mouth daily as needed. 12/13/23   Silver Fell LABOR, PA  methadone  (DOLOPHINE) 10 MG/ML solution Take 120 mg by mouth daily.    [provider]  mupirocin  ointment (BACTROBAN ) 2 % Apply to affected area 2 times daily Patient not taking: Reported on 11/14/2022 05/15/18   Cheryle Page, MD  omeprazole  (PRILOSEC) 40 MG capsule Take 1 capsule (40 mg total) by mouth daily before breakfast. 11/14/22   Rudy Josette RAMAN, PA-C    Allergies: Patient has no known allergies.    Review of Systems  Respiratory:  Positive for cough.   Gastrointestinal:  Negative for abdominal pain.    Updated Vital Signs BP 133/85   Pulse (!) 54   Temp 98.1 F (36.7 C) (Oral)   Resp 20   Ht 5' 8 (1.727 m)   Wt 88.2 kg   SpO2 91%   BMI 29.57 kg/m   Physical Exam Vitals and nursing note reviewed.  Constitutional:      General: He is not in acute distress.    Appearance: He is not toxic-appearing.  HENT:     Head: Normocephalic and atraumatic.  Eyes:     General: No scleral icterus.    Conjunctiva/sclera: Conjunctivae normal.  Cardiovascular:     Rate and Rhythm: Normal rate and regular rhythm.     Pulses: Normal pulses.     Heart sounds: Normal heart sounds.  Pulmonary:     Effort: Pulmonary effort is normal. No respiratory distress.  Breath sounds: Normal breath sounds.  Abdominal:     General: Abdomen is flat. Bowel sounds are normal. There is no distension.     Palpations: Abdomen is soft. There is no mass.     Tenderness: There is no abdominal tenderness.  Musculoskeletal:     Right lower leg: No edema.     Left lower leg: No edema.  Skin:    General: Skin is warm and dry.     Findings: No lesion.  Neurological:     General: No focal deficit present.     Mental Status: He is alert and oriented to person, place, and time. Mental status is at baseline.     (all labs ordered are listed, but only abnormal results are displayed) Labs Reviewed  BASIC METABOLIC PANEL WITH GFR - Abnormal; Notable for the following components:      Result Value    Glucose, Bld 105 (*)    All other components within normal limits  HEPATIC FUNCTION PANEL - Abnormal; Notable for the following components:   AST 71 (*)    ALT 142 (*)    All other components within normal limits  CULTURE, BLOOD (ROUTINE X 2)  CULTURE, BLOOD (ROUTINE X 2)  CBC  LACTIC ACID, PLASMA  LACTIC ACID, PLASMA  URINE DRUG SCREEN    EKG: None  Radiology: CT Angio Chest PE W and/or Wo Contrast Result Date: 06/19/2024 EXAM: CTA CHEST AORTA 06/19/2024 04:53:21 PM TECHNIQUE: CTA of the chest was performed after the administration of 75 mL of iohexol  (OMNIPAQUE ) 350 MG/ML injection. Multiplanar reformatted images are provided for review. MIP images are provided for review. Automated exposure control, iterative reconstruction, and/or weight based adjustment of the mA/kV was utilized to reduce the radiation dose to as low as reasonably achievable. COMPARISON: None available. CLINICAL HISTORY: Pulmonary embolism (PE) suspected, high prob; hemoptysis. FINDINGS: AORTA: No thoracic aortic dissection. No aneurysm. MEDIASTINUM: The heart and pericardium demonstrate no acute abnormality. LYMPH NODES: Prominent /upper limits of normal for right hilar and interlobar lymph nodes. No left hilar lymphadenopathy. No mediastinal or axillary lymph nodes. LUNGS AND PLEURA: Right upper lobe cavitary consolidation measuring 3.3 x 2.8 cm. Associated satellite micronodules versus consolidation (6:40-47). Diffuse mild bronchial wall thickening. No pulmonary edema. No pleural effusion or pneumothorax. UPPER ABDOMEN: Small hiatal hernia. SOFT TISSUES AND BONES: Multilevel vertebral body height loss along the upper mid thoracic spine that may represent old healed fractures. No acute soft tissue abnormality. IMPRESSION: 1. No evidence of pulmonary embolism. 2. Right upper lobe cavitary consolidation measuring 3.3 x 2.8 cm with associated satellite micronodules versus consolidation. Finding concerning for infection versus  malignancy. Recommend pulmonology or cardiothoracic surgery consultation. 3. Prominent right hilar and interlobar lymph nodes, upper limits of normal. Recommend additional follow up. 4. Other, non-acute and/or normal findings as above. Electronically signed by: Morgane Naveau MD 06/19/2024 05:07 PM EST RP Workstation: [id number]   DG Chest 2 View Result Date: 06/19/2024 CLINICAL DATA:  Hemoptysis. EXAM: CHEST - 2 VIEW COMPARISON:  Chest radiograph dated 12/13/2023. FINDINGS: A 2.5 x 2.5 cm cavitary nodule in the right upper lobe. This may represent a fungal infection, abscess, or TB. Other etiologies include septic emboli or necrotic malignancy. Further evaluation with CT recommended. No pleural effusion pneumothorax. The cardiac silhouette is within normal limits. No acute osseous pathology. IMPRESSION: Right upper lobe cavitary nodule. Further evaluation with CT recommended. Electronically Signed   By: Vanetta Chou M.D.   On: 06/19/2024 13:27  Procedures   Medications Ordered in the ED  iohexol (OMNIPAQUE) 350 MG/ML injection 100 mL (75 mLs Intravenous Contrast Given 06/19/24 1647)    Clinical Course as of 06/19/24 1913  Wed Jun 19, 2024  1902 Ambulated with steady gait, no SOB, no hypoxia.  [JB]    Clinical Course User Index [JB] Jilda Kress, Warren SAILOR, PA-C                                 Medical Decision Making Amount and/or Complexity of Data Reviewed Labs: ordered. Radiology: ordered.  Risk OTC drugs. Prescription drug management.   This patient presents to the ED for concern of mopped assist, this involves an extensive number of treatment options, and is a complaint that carries with it a high risk of complications and morbidity.  The differential diagnosis includes pneumonia, pneumothorax, ACS, PE, mass   Co morbidities that complicate the patient evaluation  History of IV drug use Tobacco use   Additional history obtained:  Additional history obtained from was  seen 12/13/2023 for right sided chest wall pain.  No acute findings on imaging at that time.   Lab Tests:  I personally interpreted labs.  The pertinent results include:   Leukocytosis, no anemia.  BMP unremarkable adequate hepatic function panel, blood cultures and lactic. Unable to obtain QuantiFERON TB test at this facility.   Imaging Studies ordered:  I ordered imaging studies including x-ray showing right upper cavitary lesion recommending CT of the chest. I independently visualized and interpreted imaging which showed no pulmonary embolism, right upper lobe cavitation 3 x 3 cm concerning for infection versus malignancy, prominent lymph nodes.  Recommending pulmonary consult. I agree with the radiologist interpretation   Cardiac Monitoring: / EKG:  The patient was maintained on a cardiac monitor.     Consultations Obtained:  I requested consultation with the pulmonology,  and discussed lab and imaging findings as well as pertinent plan - they recommend: trial of PO Abx given patient is stable and close OP f/u with pulmonology.    Problem List / ED Course / Critical interventions / Medication management  Presents emergency room with complaint of hemoptysis.  He has had 1 episode on Monday and 1 episode today.  On arrival he is hemodynamically stable and well-appearing.  He is saturating 96 to 93% on room air.  He does not have a fever.  He has no leukocytosis.  He does admit to significant smoking history and past medical history of IV drug use.  He is denying any significant shortness of breath, chest pain or worsening cough.  He he has not had fever at home.  CT scan is with  cavitary consolidation right upper lobe which is concerning for infection versus malignancy. Will reach out to pulmonary for recommendations.  Pulmonology was reviewed patient's imaging.  Given hemoptysis stop and he is stable here feel trial of Augmentin  outpatient is appropriate. I ordered medication  including given first dose of Augmentin  here. Reevaluation of the patient after these medicines showed that the patient stayed the same I have reviewed the patients home medicines and have made adjustments as needed. Patient to follow-up with pulmonology.  He was given strict return precautions.  He will try antibiotic.  Hemodynamically stable and well-appearing here.        Final diagnoses:  Pulmonary lesion  Hemoptysis    ED Discharge Orders  Ordered    amoxicillin -clavulanate (AUGMENTIN ) 875-125 MG tablet  Every 12 hours        06/19/24 1833    amoxicillin -clavulanate (AUGMENTIN ) 875-125 MG tablet  Every 12 hours        06/19/24 1833    nicotine  (NICODERM CQ  - DOSED IN MG/24 HOURS) 14 mg/24hr patch  Daily        06/19/24 1904               Terrace Fontanilla N, PA-C 06/19/24 1913    Simon Lavonia SAILOR, MD 06/19/24 [phone]

## 2024-06-19 NOTE — Discharge Instructions (Addendum)
 Please call pulmonary clinical tomorrow morning to schedule appointment as soon as possible.   I have sent nicotine  patches to your pharmacy.  If you choose to use nicotine  patches you should not smoke at this.  If you are interested in further smoking sensation I would recommend following up with pulmonology for further discussion on this.  In the meantime we will started you on antibiotic. Return to ER with new or worsening symptoms. If bleeding worsens, please return to ER. Please avoid NSAID, alcohol.

## 2024-06-19 NOTE — ED Notes (Signed)
 RT ambulated with patient while pulse ox was attached. Pt's O2 level was 92-93% during ambulation. Pt's HR was in the 80's and he never complained of SOB.

## 2024-06-19 NOTE — Telephone Encounter (Signed)
 Follow-up appointment for lung nodule-left upper lobe cavitary nodule  Was seen in [facility] for hemoptysis Will be treated with a course of Augmentin   Needs follow-up

## 2024-06-20 ENCOUNTER — Other Ambulatory Visit (HOSPITAL_BASED_OUTPATIENT_CLINIC_OR_DEPARTMENT_OTHER): Payer: Self-pay

## 2024-06-20 NOTE — Telephone Encounter (Signed)
 ATC x1.  Left detailed message per DPR.  When call is returned, he needs a new patient appointment (new consult) 30 minute slot.

## 2024-06-21 NOTE — Telephone Encounter (Signed)
 Pt is scheduled to see Dr Adrien on 06-25-24. NFN

## 2024-06-24 LAB — CULTURE, BLOOD (ROUTINE X 2)
Culture: NO GROWTH
Culture: NO GROWTH

## 2024-06-24 NOTE — Progress Notes (Signed)
 "  New Patient Pulmonology Office Visit   Subjective:  Patient ID: Jeffrey Schultz, male    DOB: 01-Aug-1981  MRN: [id number]  Referred by: Renato Dorothey HERO, NP  CC:  Chief Complaint  Patient presents with   Consult    ED 11/19 for Hemopytosis. Productive coughing w/ dark green phlegm & occasional wheezing. SOB while resting & exertion.    Discussed the use of AI scribe software for clinical note transcription with the patient, who gave verbal consent to proceed.  History of Present Illness Jeffrey Schultz is a 43 year old male with prior history of IVDU, but he has not used 3 years ago, he is on methadone, current smoker 1-1.5ppd who presents for evaluation of abnormal CT scan.  He has had a chronic persistent cough for an extended period related to smoking. 3 weeks ago, he started having streak of blood mixed with sputum, and then dark blood like 2 teaspoon, which prompting an ER visit where he had a CT scan showing RUL cavitary lesion 3.3x2.8 cm and some LAD. He was started on a seven-day course of augmentin .  He has had shortness of breath for the past month with talking and walking, with increased work of breathing. He denies asthma, COPD, or recurrent pneumonias. He notes occasional wheezing and has not used inhalers recently.  He gained about 80 pounds over three years related to decreased activity after quitting drugs. He has been abstinent from illicit drugs for nearly three years and is on methadone maintenance. He previously used IV drugs. Denied vaping or using marijuana.  He smokes 1 to 1.5 packs of cigarettes daily since age 52 and wants to quit.   He takes omeprazole  and reports no other medical conditions. He denies family history of lung cancer, fever, chills, nausea, vomiting, or recent aspiration events. He notes a non-itchy rash on his legs and feet for about six months.  ROS as above.  Allergies: Patient has no known allergies.  Current Outpatient Medications:     amoxicillin -clavulanate (AUGMENTIN ) 875-125 MG tablet, Take 1 tablet by mouth every 12 (twelve) hours., Disp: 14 tablet, Rfl: 0   amoxicillin -clavulanate (AUGMENTIN ) 875-125 MG tablet, Take 1 tablet by mouth every 12 (twelve) hours., Disp: 14 tablet, Rfl: 0   cyclobenzaprine  (FLEXERIL ) 10 MG tablet, Take 1 tablet (10 mg total) by mouth 2 (two) times daily as needed for muscle spasms., Disp: 20 tablet, Rfl: 0   ibuprofen  (ADVIL ,MOTRIN ) 200 MG tablet, Take 4 tablets (800 mg total) by mouth every 6 (six) hours as needed for moderate pain. (Patient not taking: Reported on 11/14/2022), Disp: 30 tablet, Rfl: 0   lidocaine  (LIDODERM ) 5 %, Place 1 patch onto the skin daily. Remove & Discard patch within 12 hours or as directed by MD, Disp: 30 patch, Rfl: 0   meloxicam  (MOBIC ) 15 MG tablet, Take 1 tablet (15 mg total) by mouth daily as needed., Disp: 30 tablet, Rfl: 0   methadone (DOLOPHINE) 10 MG/ML solution, Take 120 mg by mouth daily., Disp: , Rfl:    mupirocin  ointment (BACTROBAN ) 2 %, Apply to affected area 2 times daily (Patient not taking: Reported on 11/14/2022), Disp: 22 g, Rfl: 0   nicotine  (NICODERM CQ  - DOSED IN MG/24 HOURS) 14 mg/24hr patch, Place 1 patch (14 mg total) onto the skin daily., Disp: 28 patch, Rfl: 0   omeprazole  (PRILOSEC) 40 MG capsule, Take 1 capsule (40 mg total) by mouth daily before breakfast., Disp: 30 capsule, Rfl: 5 Past Medical  History:  Diagnosis Date   Anxiety    Depression    Drug overdose    Substance abuse [facility])    Past Surgical History:  Procedure Laterality Date   I & D EXTREMITY Left 05/13/2018   Procedure: IRRIGATION AND DEBRIDEMENT ELBOW ABSCESS;  Surgeon: Elsa Lonni SAUNDERS, MD;  Location: [facility] OR;  Service: Orthopedics;  Laterality: Left;   Family History  Problem Relation Age of Onset   Cirrhosis Father    Liver cancer Father    Colon cancer Neg Hx    Social History   Socioeconomic History   Marital status: Single    Spouse name: Not on file    Number of children: Not on file   Years of education: Not on file   Highest education level: Not on file  Occupational History   Not on file  Tobacco Use   Smoking status: Every Day    Current packs/day: 1.00    Types: Cigarettes   Smokeless tobacco: Never  Vaping Use   Vaping status: Never Used  Substance and Sexual Activity   Alcohol use: No   Drug use: Not Currently    Types: Heroin, Benzodiazepines, Marijuana, IV    Comment: clean for 3 years   Sexual activity: Not on file  Other Topics Concern   Not on file  Social History Narrative   Not on file   Social Drivers of Health   Financial Resource Strain: Not on file  Food Insecurity: Not on file  Transportation Needs: Not on file  Physical Activity: Not on file  Stress: Not on file  Social Connections: Not on file  Intimate Partner Violence: Not on file       Objective:  There were no vitals taken for this visit. Wt Readings from Last 3 Encounters:  06/25/24 208 lb 3.2 oz (94.4 kg)  06/19/24 194 lb 7.1 oz (88.2 kg)  11/14/22 194 lb 6.4 oz (88.2 kg)   BMI Readings from Last 3 Encounters:  06/25/24 33.60 kg/m  06/19/24 29.57 kg/m  11/14/22 29.56 kg/m   SpO2 Readings from Last 3 Encounters:  06/25/24 94%  06/19/24 91%  12/13/23 95%    Physical Exam Constitutional:      Appearance: Normal appearance.  HENT:     Head: Normocephalic.  Eyes:     Pupils: Pupils are equal, round, and reactive to light.  Cardiovascular:     Rate and Rhythm: Normal rate and regular rhythm.     Comments: No murmur Pulmonary:     Effort: Pulmonary effort is normal.     Breath sounds: Normal breath sounds.  Musculoskeletal:        General: Normal range of motion.  Skin:    Comments: Rash in the LE present   Neurological:     General: No focal deficit present.     Mental Status: He is alert and oriented to person, place, and time.        CTA 06/19/24 1. No evidence of pulmonary embolism. 2. Right upper lobe  cavitary consolidation measuring 3.3 x 2.8 cm with associated satellite micronodules versus consolidation. Finding concerning for infection versus malignancy. Recommend pulmonology or cardiothoracic surgery consultation. 3. Prominent right hilar and interlobar lymph nodes, upper limits of normal. Recommend additional follow up. 4. Other, non-acute and/or normal findings as above  06/19/24 - blood cultures negative.  Assessment & Plan:    Assessment & Plan Necrotizing pneumonia, cavitary lesion of right upper lobe with hemoptysis and shortness of breath CT  scan showed cavitary lesion in the RUL. His blood cultures were negative, no leukocytosis. Drug screen only +ve for methadone. Differential includes likely infection, malignancy if unresolved with antibiotics. Hemoptysis has resolved. Denied aspiration events. - Extended antibiotics for three weeks (total of four weeks). - Ordered follow-up CT scan in six weeks to assess resolution of cavitary lesion. - Prescribed albuterol  inhaler for emergency use for wheezing or shortness of breath. - Scheduled pulmonary function test to evaluate for COPD. - Instructed to go to ER if symptoms worsen or if hemoptysis recurs. - If cavitary lesion does not resolved, plan for quantiferon, ANCA, BAL, with EBUS, TBBX.  Tobacco use disorder Chronic tobacco use disorder 1-1.5ppd. Smoking cessation crucial to prevent respiratory complications. - Advised gradual reduction of cigarette consumption by two cigarettes every two weeks. - Discussed benefits of smoking cessation on respiratory health and overall well-being.  Obesity Increased weight 80 pounds in the last 3 years. BMI 33.  - Encouraged weight loss through diet and exercise.   Return in about 8 weeks (around 08/19/2024), or Schedule PFT the same day.    Marny Patch, MD Pulmonary and Critical Care Medicine [facility] "

## 2024-06-25 ENCOUNTER — Ambulatory Visit: Payer: MEDICAID

## 2024-06-25 ENCOUNTER — Other Ambulatory Visit (HOSPITAL_BASED_OUTPATIENT_CLINIC_OR_DEPARTMENT_OTHER): Payer: Self-pay

## 2024-06-25 VITALS — BP 146/88 | HR 92 | Temp 98.1°F | Ht 66.0 in | Wt 208.2 lb

## 2024-06-25 DIAGNOSIS — J850 Gangrene and necrosis of lung: Secondary | ICD-10-CM | POA: Diagnosis not present

## 2024-06-25 MED ORDER — AMOXICILLIN-POT CLAVULANATE 875-125 MG PO TABS
1.0000 | ORAL_TABLET | Freq: Two times a day (BID) | ORAL | 0 refills | Status: AC
  Filled 2024-06-25: qty 42, 21d supply, fill #0

## 2024-06-25 MED ORDER — ALBUTEROL SULFATE HFA 108 (90 BASE) MCG/ACT IN AERS
1.0000 | INHALATION_SPRAY | Freq: Four times a day (QID) | RESPIRATORY_TRACT | 6 refills | Status: AC | PRN
  Filled 2024-06-25: qty 6.7, 25d supply, fill #0

## 2024-06-25 NOTE — Patient Instructions (Addendum)
 Dear Jeffrey Schultz;   Given the lesion in your lung, I will recommend the following:  -Repeat CT chest in 6 weeks around the first week of January. -I will recommend to complete 3 weeks more of antibiotics. Augmentin  1 pill twice a day.  -Pulmonary function test at the next appointment.  -I will send you an albuterol  inhaler, you can use it as need it for shortness of breath. -If you developed worsening of your shortness of breath, coughing up blood, please go to the ER for evaluation.  I will see you mid January.

## 2024-07-16 ENCOUNTER — Ambulatory Visit: Payer: MEDICAID | Admitting: Gastroenterology

## 2024-07-16 ENCOUNTER — Encounter: Payer: Self-pay | Admitting: Gastroenterology

## 2024-07-16 ENCOUNTER — Other Ambulatory Visit (INDEPENDENT_AMBULATORY_CARE_PROVIDER_SITE_OTHER): Payer: MEDICAID

## 2024-07-16 VITALS — BP 148/90 | HR 81 | Ht 66.0 in | Wt 211.2 lb

## 2024-07-16 DIAGNOSIS — R7989 Other specified abnormal findings of blood chemistry: Secondary | ICD-10-CM

## 2024-07-16 DIAGNOSIS — K760 Fatty (change of) liver, not elsewhere classified: Secondary | ICD-10-CM

## 2024-07-16 DIAGNOSIS — K219 Gastro-esophageal reflux disease without esophagitis: Secondary | ICD-10-CM

## 2024-07-16 LAB — IBC + FERRITIN
Ferritin: 15.1 ng/mL — ABNORMAL LOW (ref 22.0–322.0)
Iron: 67 ug/dL (ref 42–165)
Saturation Ratios: 13.6 % — ABNORMAL LOW (ref 20.0–50.0)
TIBC: 491.4 ug/dL — ABNORMAL HIGH (ref 250.0–450.0)
Transferrin: 351 mg/dL (ref 212.0–360.0)

## 2024-07-16 LAB — PROTIME-INR
INR: 1.1 ratio — ABNORMAL HIGH (ref 0.8–1.0)
Prothrombin Time: 11.3 s (ref 9.6–13.1)

## 2024-07-16 NOTE — Patient Instructions (Signed)
 Your provider has requested that you go to the basement level for lab work before leaving today. Press B on the elevator. The lab is located at the first door on the left as you exit the elevator.  _______________________________________________________  If your blood pressure at your visit was 140/90 or greater, please contact your primary care physician to follow up on this.  _______________________________________________________  If you are age 41 or older, your body mass index should be between 23-30. Your Body mass index is 34.09 kg/m. If this is out of the aforementioned range listed, please consider follow up with your Primary Care Provider.  If you are age 20 or younger, your body mass index should be between 19-25. Your Body mass index is 34.09 kg/m. If this is out of the aformentioned range listed, please consider follow up with your Primary Care Provider.   ________________________________________________________  The [facility] providers would like to encourage you to use MYCHART to communicate with providers for non-urgent requests or questions.  Due to long hold times on the telephone, sending your provider a message by [facility] may be a faster and more efficient way to get a response.  Please allow 48 business hours for a response.  Please remember that this is for non-urgent requests.  _______________________________________________________  [facility] is using a team-based approach to care.  Your team is made up of your doctor and two to three APPS. Our APPS (Nurse Practitioners and Physician Assistants) work with your physician to ensure care continuity for you. They are fully qualified to address your health concerns and develop a treatment plan. They communicate directly with your gastroenterologist to care for you. Seeing the Advanced Practice Practitioners on your physician's team can help you by facilitating care more promptly, often allowing for earlier  appointments, access to diagnostic testing, procedures, and other specialty referrals.

## 2024-07-16 NOTE — Progress Notes (Signed)
 Discussed the use of AI scribe software for clinical note transcription with the patient, who gave verbal consent to proceed.  HPI : Jeffrey Schultz is a 43 year old male who presents with elevated liver enzymes.   He has a history of elevated liver enzymes, though the duration is unclear.   A hepatic panel from last month showed elevated aminotransferases (ALT 142, AST 71), otherwise normal.  Previous hepatic panels from 2019 and earlier were normal.  He recalls a positive hepatitis C test in the past but is unsure of the timing. He has a history of drug use for seven to eight years, which he acknowledges as a risk factor for hepatitis C and B. 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  He has gained weight since becoming clean, now weighing over 200 pounds, up from a previous weight of 115 pounds.  An ultrasound performed last year showed steatosis. He reports that his father had liver problems, possibly related to alcohol use.  He experiences abdominal pain under his ribs, described as a tingling sensation similar to 'your foot's asleep,' which has been present for about a month. No nausea, vomiting, diarrhea, or blood in the stool. He reports regular bowel movements.  He also reports a history of heartburn, which he has been managing with omeprazole  for the past two months, and states it works well as long as he takes it.  He was recently diagnosed with necrotizing pneumonia with RUL cavitary lesion and is still on antibiotics, with a week left of treatment.  He is still smoking, but is trying to quit.  He reports abdominal distension that resolves quickly, which he attributes to gas rather than fluid.  No history of ascites, jaundice or encephalopathy.      RUQUS Oct 2024 IMPRESSION: 1. Increased hepatic parenchymal echogenicity suggestive of steatosis. 2. No cholelithiasis or sonographic evidence for acute cholecystitis.  Past Medical  History:  Diagnosis Date   Anxiety    Depression    Drug overdose    Substance abuse [facility])      Past Surgical History:  Procedure Laterality Date   I & D EXTREMITY Left 05/13/2018   Procedure: IRRIGATION AND DEBRIDEMENT ELBOW ABSCESS;  Surgeon: Elsa Lonni SAUNDERS, MD;  Location: [facility] OR;  Service: Orthopedics;  Laterality: Left;   Family History  Problem Relation Age of Onset   Cirrhosis Father    Liver cancer Father    Colon cancer Neg Hx    Social History[1] Current Outpatient Medications  Medication Sig Dispense Refill   albuterol  (VENTOLIN  HFA) 108 (90 Base) MCG/ACT inhaler Inhale 1-2 puffs into the lungs every 6 (six) hours as needed for wheezing or shortness of breath. 6.7 g 6   amoxicillin -clavulanate (AUGMENTIN ) 875-125 MG tablet Take 1 tablet by mouth every 12 (twelve) hours. 14 tablet 0   amoxicillin -clavulanate (AUGMENTIN ) 875-125 MG tablet Take 1 tablet by mouth every 12 (twelve) hours for 21 days. 42 tablet 0   cyclobenzaprine  (FLEXERIL ) 10 MG tablet Take 1 tablet (10 mg total) by mouth 2 (two) times daily as needed for muscle spasms. (Patient not taking: Reported on 06/25/2024) 20 tablet 0   ibuprofen  (ADVIL ,MOTRIN ) 200 MG tablet Take 4 tablets (800 mg total) by mouth every 6 (six) hours as needed for moderate pain. (Patient not taking: Reported on 06/25/2024) 30 tablet 0   lidocaine  (LIDODERM ) 5 % Place 1 patch onto the skin daily. Remove & Discard patch within  12 hours or as directed by MD 30 patch 0   meloxicam  (MOBIC ) 15 MG tablet Take 1 tablet (15 mg total) by mouth daily as needed. (Patient not taking: Reported on 06/25/2024) 30 tablet 0   methadone (DOLOPHINE) 10 MG/ML solution Take 120 mg by mouth daily.     mupirocin  ointment (BACTROBAN ) 2 % Apply to affected area 2 times daily 22 g 0   nicotine  (NICODERM CQ  - DOSED IN MG/24 HOURS) 14 mg/24hr patch Place 1 patch (14 mg total) onto the skin daily. (Patient not taking: Reported on 06/25/2024) 28 patch 0    omeprazole  (PRILOSEC) 40 MG capsule Take 1 capsule (40 mg total) by mouth daily before breakfast. 30 capsule 5   No current facility-administered medications for this visit.   Allergies[2]   Review of Systems: All systems reviewed and negative except where noted in HPI.    CT Angio Chest PE W and/or Wo Contrast Result Date: 06/19/2024 EXAM: CTA CHEST AORTA 06/19/2024 04:53:21 PM TECHNIQUE: CTA of the chest was performed after the administration of 75 mL of iohexol  (OMNIPAQUE ) 350 MG/ML injection. Multiplanar reformatted images are provided for review. MIP images are provided for review. Automated exposure control, iterative reconstruction, and/or weight based adjustment of the mA/kV was utilized to reduce the radiation dose to as low as reasonably achievable. COMPARISON: None available. CLINICAL HISTORY: Pulmonary embolism (PE) suspected, high prob; hemoptysis. FINDINGS: AORTA: No thoracic aortic dissection. No aneurysm. MEDIASTINUM: The heart and pericardium demonstrate no acute abnormality. LYMPH NODES: Prominent /upper limits of normal for right hilar and interlobar lymph nodes. No left hilar lymphadenopathy. No mediastinal or axillary lymph nodes. LUNGS AND PLEURA: Right upper lobe cavitary consolidation measuring 3.3 x 2.8 cm. Associated satellite micronodules versus consolidation (6:40-47). Diffuse mild bronchial wall thickening. No pulmonary edema. No pleural effusion or pneumothorax. UPPER ABDOMEN: Small hiatal hernia. SOFT TISSUES AND BONES: Multilevel vertebral body height loss along the upper mid thoracic spine that may represent old healed fractures. No acute soft tissue abnormality. IMPRESSION: 1. No evidence of pulmonary embolism. 2. Right upper lobe cavitary consolidation measuring 3.3 x 2.8 cm with associated satellite micronodules versus consolidation. Finding concerning for infection versus malignancy. Recommend pulmonology or cardiothoracic surgery consultation. 3. Prominent right  hilar and interlobar lymph nodes, upper limits of normal. Recommend additional follow up. 4. Other, non-acute and/or normal findings as above. Electronically signed by: Morgane Naveau MD 06/19/2024 05:07 PM EST RP Workstation: [id number]   DG Chest 2 View Result Date: 06/19/2024 CLINICAL DATA:  Hemoptysis. EXAM: CHEST - 2 VIEW COMPARISON:  Chest radiograph dated 12/13/2023. FINDINGS: A 2.5 x 2.5 cm cavitary nodule in the right upper lobe. This may represent a fungal infection, abscess, or TB. Other etiologies include septic emboli or necrotic malignancy. Further evaluation with CT recommended. No pleural effusion pneumothorax. The cardiac silhouette is within normal limits. No acute osseous pathology. IMPRESSION: Right upper lobe cavitary nodule. Further evaluation with CT recommended. Electronically Signed   By: Vanetta Chou M.D.   On: 06/19/2024 13:27    Physical Exam: BP (!) 148/90   Pulse 81   Ht 5' 6 (1.676 m)   Wt 211 lb 3.2 oz (95.8 kg)   BMI 34.09 kg/m  Constitutional: Pleasant,well-developed, Caucasian male in no acute distress.  Accompanied by girlfriend HEENT: Normocephalic and atraumatic. Conjunctivae are normal. No scleral icterus.  Dentures present Neck supple.  Cardiovascular: Normal rate, regular rhythm.  Pulmonary/chest: Effort normal and breath sounds normal. No wheezing, rales or rhonchi. Abdominal: Soft, nondistended,  nontender. Bowel sounds active throughout. There are no masses palpable. No hepatomegaly. Extremities: no edema Lymphadenopathy: No cervical adenopathy noted. Neurological: Alert and oriented to person place and time. Skin: Skin is warm and dry. No rashes noted. Psychiatric: Normal mood and affect. Behavior is normal.  CBC    Component Value Date/Time   WBC 7.5 06/19/2024 1216   RBC 5.23 06/19/2024 1216   HGB 14.3 06/19/2024 1216   HCT 43.9 06/19/2024 1216   PLT 275 06/19/2024 1216   MCV 83.9 06/19/2024 1216   MCH 27.3 06/19/2024 1216   MCHC  32.6 06/19/2024 1216   RDW 14.2 06/19/2024 1216   LYMPHSABS 2.4 05/15/2018 0117   MONOABS 0.5 05/15/2018 0117   EOSABS 0.1 05/15/2018 0117   BASOSABS 0.0 05/15/2018 0117    CMP     Component Value Date/Time   NA 138 06/19/2024 1216   K 4.3 06/19/2024 1216   CL 100 06/19/2024 1216   CO2 29 06/19/2024 1216   GLUCOSE 105 (H) 06/19/2024 1216   BUN 15 06/19/2024 1216   CREATININE 0.77 06/19/2024 1216   CALCIUM 10.0 06/19/2024 1216   PROT 7.5 06/19/2024 1812   ALBUMIN 4.2 06/19/2024 1812   AST 71 (H) 06/19/2024 1812   ALT 142 (H) 06/19/2024 1812   ALKPHOS 88 06/19/2024 1812   BILITOT 0.3 06/19/2024 1812   GFRNONAA >60 06/19/2024 1216   GFRAA >60 05/15/2018 0117       Latest Ref Rng & Units 06/19/2024   12:16 PM 05/15/2018    1:17 AM 05/14/2018    2:49 AM  CBC EXTENDED  WBC 4.0 - 10.5 K/uL 7.5  11.9  26.2   RBC 4.22 - 5.81 MIL/uL 5.23  3.83  3.96   Hemoglobin 13.0 - 17.0 g/dL 85.6  89.1  88.7   HCT 39.0 - 52.0 % 43.9  33.5  34.2   Platelets 150 - 400 K/uL 275  392  349   NEUT# 1.7 - 7.7 K/uL  8.9  23.8   Lymph# 0.7 - 4.0 K/uL  2.4  1.0       ASSESSMENT AND PLAN:   Chronic liver disease evaluation (fatty liver with elevated liver enzymes) Mild liver enzyme elevations with steatosis. Risk factors include past IV drug use, potential hepatitis B or C, and recent weight gain. Differential includes viral, metabolic, autoimmune, and genetic liver diseases. - Ordered blood tests for hepatitis B, hepatitis C, autoimmune liver disease, and genetic causes. - Refer to infectious disease if hepatitis B or C confirmed. - Consider liver stiffness test based on results. - Advised continued abstinence from alcohol. - Encouraged smoking cessation.  Gastroesophageal reflux disease Symptoms well-controlled with current medication. - Continue current medication.  Right sided neuropathic pain - Possibly related to pneumonia, as symptoms started around the same time - Continue to  monitor.  Recording duration: 15 minutes     I spent a total of 35 minutes reviewing the patient's medical record, interviewing and examining the patient, discussing his diagnosis and management of his condition going forward, and documenting in the medical record  Tsugio Elison E. Stacia, MD [facility]    Renato Dorothey HERO, NP     [1]  Social History Tobacco Use   Smoking status: Every Day    Current packs/day: 1.00    Types: Cigarettes   Smokeless tobacco: Never   Tobacco comments:    Smokes 1ppd daily   Vaping Use   Vaping status: Never Used  Substance Use Topics  Alcohol use: No   Drug use: Not Currently    Types: Heroin, Benzodiazepines, Marijuana, IV    Comment: clean for 3 years  [2] No Known Allergies

## 2024-07-20 LAB — HCV RNA,QUANTITATIVE REAL TIME PCR
HCV Quantitative Log: 3.38 {Log_IU}/mL — ABNORMAL HIGH
HCV RNA, PCR, QN: 2410 [IU]/mL — ABNORMAL HIGH

## 2024-07-20 LAB — HEPATITIS B CORE ANTIBODY, TOTAL: Hep B Core Total Ab: NONREACTIVE

## 2024-07-20 LAB — IGG: IgG (Immunoglobin G), Serum: 1708 mg/dL — ABNORMAL HIGH (ref 600–1640)

## 2024-07-20 LAB — CERULOPLASMIN: Ceruloplasmin: 36 mg/dL — ABNORMAL HIGH (ref 14–30)

## 2024-07-20 LAB — TISSUE TRANSGLUTAMINASE, IGA: (tTG) Ab, IgA: <1 U/mL

## 2024-07-20 LAB — ANTI-SMOOTH MUSCLE ANTIBODY, IGG: Actin (Smooth Muscle) Antibody (IGG): 26 U — ABNORMAL HIGH (ref <20)

## 2024-07-20 LAB — HEPATITIS C ANTIBODY: Hepatitis C Ab: REACTIVE — AB

## 2024-07-20 LAB — IGA: Immunoglobulin A: 171 mg/dL (ref 47–310)

## 2024-07-20 LAB — HEPATITIS A ANTIBODY, TOTAL: Hepatitis A AB,Total: NONREACTIVE

## 2024-07-20 LAB — MITOCHONDRIAL ANTIBODIES: Mitochondrial M2 Ab, IgG: 36 U — ABNORMAL HIGH (ref <=20.0)

## 2024-07-20 LAB — HEPATITIS B SURFACE ANTIBODY,QUALITATIVE: Hep B S Ab: REACTIVE — AB

## 2024-07-20 LAB — HEPATITIS B SURFACE ANTIGEN: Hepatitis B Surface Ag: NONREACTIVE

## 2024-07-20 LAB — ALPHA-1-ANTITRYPSIN: A-1 Antitrypsin, Ser: 174 mg/dL (ref 83–199)

## 2024-07-22 ENCOUNTER — Ambulatory Visit: Payer: Self-pay | Admitting: Gastroenterology

## 2024-07-22 NOTE — Progress Notes (Signed)
 Jeffrey Schultz, Your labs confirmed that you have chronic hepatitis C.  We will refer you to infectious disease for treatment.  You have not been exposed to hepatitis B, but it does appear you are vaccinated against it.  You had slightly elevated levels of anti-smooth muscle antibodies and anti-mitochondrial antibodies.  These antibodies are associated with autoimmune hepatitis and primary biliary cholangitis.  I would not recommend starting any treatment for these conditions, as I am not certain you have them.  Sometimes there can be false positive antibody tests.  I would recommend you get treated for hepatitis C because we know that you have this, and it may be the sole cause of your liver enzyme elevation.  I would like to get the elastography test first to assess for liver stiffness and scarring.  Team,  Please place referral to infectious disease for treatment of hepatitis C and place order for RUQUS and elastography

## 2024-07-23 ENCOUNTER — Other Ambulatory Visit: Payer: Self-pay

## 2024-07-23 DIAGNOSIS — K760 Fatty (change of) liver, not elsewhere classified: Secondary | ICD-10-CM

## 2024-07-23 DIAGNOSIS — B1920 Unspecified viral hepatitis C without hepatic coma: Secondary | ICD-10-CM

## 2024-07-23 DIAGNOSIS — R7989 Other specified abnormal findings of blood chemistry: Secondary | ICD-10-CM

## 2024-07-31 ENCOUNTER — Ambulatory Visit (HOSPITAL_COMMUNITY)
Admission: RE | Admit: 2024-07-31 | Discharge: 2024-07-31 | Disposition: A | Discharge status: Home / Self Care | Payer: MEDICAID | Source: Ambulatory Visit | Attending: Gastroenterology | Admitting: Gastroenterology

## 2024-07-31 DIAGNOSIS — R7989 Other specified abnormal findings of blood chemistry: Secondary | ICD-10-CM | POA: Diagnosis present

## 2024-07-31 DIAGNOSIS — K760 Fatty (change of) liver, not elsewhere classified: Secondary | ICD-10-CM | POA: Insufficient documentation

## 2024-07-31 DIAGNOSIS — B1920 Unspecified viral hepatitis C without hepatic coma: Secondary | ICD-10-CM | POA: Diagnosis present

## 2024-08-02 ENCOUNTER — Ambulatory Visit: Payer: Self-pay | Admitting: Gastroenterology

## 2024-08-02 NOTE — Progress Notes (Signed)
 Jeffrey Schultz,  Your ultrasound and elastography showed signs of fatty change, which is an indicator of chronic liver damage, but did not show any significant scarring or liver stiffness.  This is good news.  Please follow up with Infectious Disease to discuss treatment of your Hepatitis C. I recommend we repeat labs for anti-mitochondrial antibodies and anti-smooth muscle antibodies in 6 months.

## 2024-08-05 ENCOUNTER — Ambulatory Visit (HOSPITAL_BASED_OUTPATIENT_CLINIC_OR_DEPARTMENT_OTHER)
Admission: RE | Admit: 2024-08-05 | Discharge: 2024-08-05 | Disposition: A | Discharge status: Home / Self Care | Payer: MEDICAID | Source: Ambulatory Visit

## 2024-08-05 DIAGNOSIS — J850 Gangrene and necrosis of lung: Secondary | ICD-10-CM | POA: Insufficient documentation

## 2024-08-07 ENCOUNTER — Ambulatory Visit: Payer: Self-pay

## 2024-08-07 DIAGNOSIS — J984 Other disorders of lung: Secondary | ICD-10-CM

## 2024-08-08 ENCOUNTER — Other Ambulatory Visit (HOSPITAL_COMMUNITY): Payer: Self-pay

## 2024-08-08 ENCOUNTER — Other Ambulatory Visit (HOSPITAL_BASED_OUTPATIENT_CLINIC_OR_DEPARTMENT_OTHER): Payer: Self-pay

## 2024-08-08 ENCOUNTER — Other Ambulatory Visit: Payer: Self-pay

## 2024-08-08 ENCOUNTER — Ambulatory Visit (INDEPENDENT_AMBULATORY_CARE_PROVIDER_SITE_OTHER): Payer: MEDICAID | Admitting: Infectious Diseases

## 2024-08-08 ENCOUNTER — Telehealth: Payer: Self-pay

## 2024-08-08 VITALS — BP 134/87 | HR 77 | Temp 98.0°F | Wt 211.8 lb

## 2024-08-08 DIAGNOSIS — B182 Chronic viral hepatitis C: Secondary | ICD-10-CM | POA: Diagnosis not present

## 2024-08-08 DIAGNOSIS — E162 Hypoglycemia, unspecified: Secondary | ICD-10-CM

## 2024-08-08 MED ORDER — OMEPRAZOLE 20 MG PO CPDR
20.0000 mg | DELAYED_RELEASE_CAPSULE | Freq: Every day | ORAL | 2 refills | Status: AC
  Filled 2024-08-08: qty 30, 30d supply, fill #0

## 2024-08-08 MED ORDER — MAVYRET 100-40 MG PO TABS
3.0000 | ORAL_TABLET | Freq: Every day | ORAL | 1 refills | Status: AC
  Filled 2024-08-08: qty 84, 28d supply, fill #0
  Filled 2024-08-30: qty 84, 28d supply, fill #1

## 2024-08-08 MED ORDER — MAVYRET 100-40 MG PO TABS: 3.0000 | ORAL_TABLET | Freq: Every day | ORAL | 1 refills | Status: DC

## 2024-08-08 NOTE — Telephone Encounter (Signed)
 [facility] Patient Advocate Encounter  Insurance verification completed.    The patient is insured through [employer].     Ran test claim for EPCLUSA The current 30 day co-pay is $4.  Ran test claim for MAVYRET  This medication will need a PA.   We will continue to follow to see if copay assistance is needed.  This test claim was processed through [facility]- copay amounts may vary at other pharmacies due to pharmacy/plan contracts, or as the patient moves through the different stages of their insurance plan.

## 2024-08-08 NOTE — Progress Notes (Signed)
 Specialty Pharmacy Initial Fill Coordination Note  Jeffrey Schultz is a 44 y.o. male contacted today regarding initial fill of specialty medication(s) Glecaprevir -Pibrentasvir  (Mavyret )   Patient requested Courier to Provider Office   Delivery date: 08/12/24   Verified address: [facility] [address] [place] [address]   Medication will be filled on: 08/09/24   Patient is aware of $0 copayment.

## 2024-08-08 NOTE — Progress Notes (Signed)
 "   Patient Name: Jeffrey Schultz  Date of Birth: 19-Dec-1980  MRN: [id number]  PCP: Renato Dorothey HERO, NP  Referring Provider: Stacia Glendia BRAVO, MD, Ph#: [phone]   Subjective   CC: New patient - initial evaluation and management of chronic hepatitis C infection.   Discussed the use of AI scribe software for clinical note transcription with the patient, who gave verbal consent to proceed.  History of Present Illness   Jeffrey Schultz is a 44 year old male with hepatitis C infection who presents for treatment preparation. He is accompanied by Charmaine, his partner. He is following with a GI specialist for liver function evaluation.  He was diagnosed with hepatitis C infection, with a viral load of 2,410 copies as of December 2025. A reactive antibody test was noted on July 16, 2024. Recent imaging, including an ultrasound and elastography, indicated signs of fatty liver but no significant scarring or fibrosis. He also had positive antimitochondrial and anti-smooth muscle antibodies, which were slightly elevated.  He has a history of drug addiction, having used drugs for six to seven years, including injectable substances. He has been clean for three years and is currently on methadone maintenance therapy.  He takes omeprazole  daily for severe acid reflux, describing it as 'I could drink something and get it.'  He experiences episodes of low blood sugar, particularly when he does not eat in the morning, but also after eating. Symptoms include feeling cold, clammy, sweaty, and near fainting. He is not on any diabetes medications.  He has gained approximately 80 pounds, which he attributes to coming off drugs.  No visual changes.       ROS  Past Medical History:  Diagnosis Date   Anxiety    Depression    Drug overdose    Substance abuse (HCC)     Outpatient Medications Prior to Visit  Medication Sig Dispense Refill   albuterol  (VENTOLIN  HFA) 108 (90 Base) MCG/ACT  inhaler Inhale 1-2 puffs into the lungs every 6 (six) hours as needed for wheezing or shortness of breath. 6.7 g 6   ibuprofen  (ADVIL ,MOTRIN ) 200 MG tablet Take 4 tablets (800 mg total) by mouth every 6 (six) hours as needed for moderate pain. 30 tablet 0   methadone (DOLOPHINE) 10 MG/ML solution Take 120 mg by mouth daily.     omeprazole  (PRILOSEC) 40 MG capsule Take 1 capsule (40 mg total) by mouth daily before breakfast. 30 capsule 5   amoxicillin -clavulanate (AUGMENTIN ) 875-125 MG tablet Take 1 tablet by mouth every 12 (twelve) hours. (Patient not taking: Reported on 08/08/2024) 14 tablet 0   lidocaine  (LIDODERM ) 5 % Place 1 patch onto the skin daily. Remove & Discard patch within 12 hours or as directed by MD (Patient not taking: Reported on 08/08/2024) 30 patch 0   mupirocin  ointment (BACTROBAN ) 2 % Apply to affected area 2 times daily (Patient not taking: Reported on 08/08/2024) 22 g 0   No facility-administered medications prior to visit.     Allergies[1]  Social History[2]  Family History  Problem Relation Age of Onset   Cirrhosis Father    Liver cancer Father    Colon cancer Neg Hx          Objective   Today's Vitals   08/08/24 1411  BP: 134/87  Pulse: 77  Temp: 98 F (36.7 C)  TempSrc: Oral  SpO2: 94%  Weight: 211 lb 12.8 oz (96.1 kg)   Body mass index is 34.19 kg/m.  Constitutional:  in no apparent distress Eyes: anicteric Cardiovascular: Cor RRR Respiratory: normal effort Gastrointestinal; unremarkable  Musculoskeletal: not examined Skin: negative for - jaundice, spider hemangioma, telangiectasia, palmar erythema, ecchymosis and atrophy; no porphyria cutanea tarda Lymphatic: no cervical lymphadenopathy   Assessment & Plan:      Chronic hepatitis C infection - Viral load of 2,410 copies as of December 2025. GT3. No significant fibrosis or scarring on recent ultrasound and elastography. Fatty liver disease detected. Positive antimicrobial mitochondrial  antibodies and anti-smooth muscle antibodies. Previous drug use likely source of infection. Cleared of drug use for three years. Mavyret  chosen for treatment due to compatibility with low dose omeprazole  use. Treatment duration is 8 weeks with food. Would avoid. Potential side effects include fatigue and headaches, which typically resolve after initial weeks.-  - Will start Mavyret  treatment due to drug interactions with epclusa - Reduce omeprazole  dose to 20 mg max per day while on Mavyret . - Monitor liver function tests halfway through treatment. - Plan for test of cure three months after last dose of Mavyret .  Hypoglycemia episodes -  Episodes of hypoglycemia with symptoms of cold, clammy, sweaty, and near syncope. Symptoms occur even after eating, suggesting possible reactive hypoglycemia. I have no suspicion that there is a direct link to hepatitis C infection. - Advised dietary modifications to include balanced meals with proteins, fats, and carbs. - can screen diabetes with A1C and free insulin level next blood draw - encouraged him to d/w his primary care provider too  Fatty liver disease - Identified on ultrasound. No significant fibrosis or scarring. Likely related to excess intra-abdominal fat and recent weight loss. Not directly linked to hepatitis C infection. - Discussed weight loss and dietary changes to manage fatty liver disease. - Provided information on Prevnar 20 vaccine for pneumonia prevention.      No orders of the defined types were placed in this encounter.   Meds ordered this encounter  Medications   DISCONTD: Glecaprevir -Pibrentasvir  (MAVYRET ) 100-40 MG TABS    Sig: Take 3 tablets by mouth daily with supper.    Dispense:  84 tablet    Refill:  1   Glecaprevir -Pibrentasvir  (MAVYRET ) 100-40 MG TABS    Sig: Take 3 tablets by mouth daily with supper.    Dispense:  84 tablet    Refill:  1   omeprazole  (PRILOSEC) 20 MG capsule    Sig: Take 1 capsule (20 mg total)  by mouth daily.    Dispense:  30 capsule    Refill:  2    Return in about 6 weeks (around 09/19/2024).  Corean Fireman, MSN, NP-C [facility] for Infectious Disease [facility]  [email or web address] Pager: [phone] Office: [phone] [facility] Main Line: [phone] *Secure Chat Communication Welcome      [1] No Known Allergies [2]  Social History Tobacco Use   Smoking status: Every Day    Current packs/day: 1.00    Types: Cigarettes   Smokeless tobacco: Never   Tobacco comments:    Smokes 1ppd daily   Vaping Use   Vaping status: Never Used  Substance Use Topics   Alcohol use: No   Drug use: Not Currently    Types: Heroin, Benzodiazepines, Marijuana, IV    Comment: clean for 3 years   "

## 2024-08-08 NOTE — Telephone Encounter (Signed)
 Pharmacy Patient Advocate Encounter  Received notification from [facility] that Prior Authorization for MAVYRET  has been CANCELLED due to MEDICATION DOES NOT REQUIRE A PA   NEEDED TO CALL AND GET AN OVERRIDE FOR COST EXCEEDED WAS ABLE TO OBTAIN AND APPROVAL FOR THE OVERRIDE FROM 08/08/24-08/08/25   PA #/Case ID/Reference #: [id number]

## 2024-08-08 NOTE — Patient Instructions (Signed)
 Nice to meet you today!    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    ABOUT HEPATITIS C VIRUS:  Chronic Hepatitis C is the most common blood-borne infection in the [place] , affecting approximately 3 million people.  It is the leading cause of cirrhosis, liver cancer, and end stage liver disease requiring transplantation when this infection goes untreated for many years  The majority of people who are infected are unaware because there are not many early symptoms that are specific to this and often go undiagnosed until a specific blood test is drawn.   The hepatitis c virus is passed primarily through direct exposure of contaminated blood or body fluids. It is most efficiently transmitted through repeated exposure to infected blood.  Risk for sexual transmission is very low but is possible if there is high frequency of unprotected sexual activity with known hepatitis c partner or multiple partners of known status.  Over time, approximately 60-70% of people can develop some degree of liver disease. Cirrhosis occurs in 10-20% of those with chronic infection. 1-5% will get liver cancer, which has a very high rate of death.   Approximately 15-25% clear the infection without medication (usually in the first 6 months of becoming exposed to virus)  Newer medications provide over 95% cure rate when taken as prescribed    IN GENERAL ABOUT DIET  Persons living with chronic hepatitis c infection should eat a diet to maintain a healthy weight and avoid nutritional deficiencies.   Completely avoiding alcohol is the best decision for your liver health. If unable to do so please limit alcohol to as little as possible to less than 1 standard drink a day - this is very irritating to your liver.  Limit tylenol  use to less than 2,000 mg daily (two extra strength tablets only twice a day)  If you have cirrhosis of the liver please take no more than 1,000 mg tylenol  a  day  Patients with cirrhosis should not have protein restriction; we recommend a protein intake of approximately 1.2-1.5 g/kg/day.   For patients with cirrhosis and hepatic encephalopathy, the American Association for the Study of Liver Diseases (AASLD) recommended protein intake is 1.2-1.5 g/kg/day.  If you experience ascites (fluid accumulation in the abdomen associated with severe liver damage / cirrhosis) please limit sodium intake to < 2000 mg a day    UNTIL YOU HAVE BEEN TREATED AND CURED:  Use condoms with all sexual encounters or practice abstinence to avoid sexual transmission   No sharing of razors, toothbrushes, nail clippers or anything that could potentially have blood on it.   If you cut yourself please clean and cover any wounds or open sores to others do not come into contact with your blood.   If blood spills onto item/surface please clean with 1:10 bleach solution and allow to dry, EVEN if it is dried blood.    GENERAL HELPFUL HINTS ON HCV THERAPY:  1. Stay well-hydrated.  2. Notify the [facility] Clinic of any changes in your other over-the-counter/herbal or prescription medications.  3. If you miss a dose of your medication, take the missed dose as soon as you remember. Return to your regular time/dose schedule the next day.   4.  Do not stop taking your medications without first talking with your healthcare provider.  5.  You will see our pharmacist-specialist within the first 2 weeks of starting your medication to monitor for any possible side effects.  6.  You  will have blood work once during treatment 4 weeks after your first pill. Again soon after treatment is completed and one final lab 3 months after your last pill to ensure cure!   TIPS TO BE SUCCESSFUL WITH DAILY MEDICATION USE:  1. Set a reminder on your phone  2. Try filling out a pill box for the week - pick a day and put one pill for every day during the week so you know right away if you missed a  pill.   3. Have a trusted family member ask you about your medications.   4. Smartphone app    Medication we would like to use for you will be :   Mavyret  Instructions:  Take Mavyret , three tablets (at the same time) daily with food. Please take ALL THREE PILLS AT ONCE. You should take it at approximately the same time every day. Treatment will be for 8 weeks. Do not miss a dose.    Do not run out of Mavyret ! If you are down to one week of medication left and have not heard about your next shipment, please let us  know as soon as possible. You will be given 28 days of treatment at a time and will receive one refill.   If you need to start a new medication, prescription from your doctor or over the counter medication, you need to contact us  to make sure it does not interfere with Mavyret . There are several medications that can interfere with Mavyret  and can make you sick or make the medication not work.  If you need to take a medication for acid reflux, you can take omeprazole  20mg  daily.     Tylenol  (acetominophen) and Advil  (ibuprofen ) are safe to take with Harvoni if needed for headache, fever, pain.   IF YOU ARE ON BIRTH CONTROL PILLS, YOU RECEIVE A SHOT FOR BIRTH CONTROL, OR YOU HAVE AN IUD, please notify your provider to make sure it is safe with Mavyret .   DO NOT stop Mavyret  unless instructed to by your provider. If you are hospitalized while taking this medication please bring it with you to the hospital to avoid interruption of therapy. Every pill is important!  The most common side effects associated with Mavyret  include:  Fatigue Headache Nausea Diarrhea Insomnia

## 2024-08-08 NOTE — Telephone Encounter (Signed)
 Pharmacy Patient Advocate Encounter   Received notification from [employer] that prior authorization for MAVYRET  is required/requested.   Insurance verification completed.   The patient is insured through [employer].   Per test claim: PA required; PA submitted to above mentioned insurance via [employer] Key/confirmation #/EOC [id number] Status is pending

## 2024-08-09 ENCOUNTER — Other Ambulatory Visit (HOSPITAL_COMMUNITY): Payer: Self-pay

## 2024-08-09 ENCOUNTER — Other Ambulatory Visit: Payer: Self-pay

## 2024-08-09 ENCOUNTER — Telehealth: Payer: Self-pay | Admitting: *Deleted

## 2024-08-09 NOTE — Telephone Encounter (Signed)
"   Adrien Winfred Berke, MD signed an order Adrien Winfred, Berke, MD to KALON ERHARDT     08/07/24  4:33 PM Hi Mr. Hommel,  Your CT scan showed decreased in size of the lesion in the Right lung. It is great news, this means the infection is resolving. I will repeat a CT in 4 months to check for a complete resolution.  Marny Adrien, MD  Thank you so much. This is great to hear! Will I need to complete more antibiotics or will the ones Ive already take finish clearing this up? Also, what does it mean now when it says it is now solid? CT CHEST HIGH RESOLUTION   08/07/24  4:34 PM ---------------------------------------------------------------------------------------------------------------------------------   Dr.  Adrien Winfred please advise regarding patient questions.  Thank you. "

## 2024-08-12 ENCOUNTER — Telehealth: Payer: Self-pay | Admitting: Pharmacist

## 2024-08-12 ENCOUNTER — Other Ambulatory Visit (HOSPITAL_BASED_OUTPATIENT_CLINIC_OR_DEPARTMENT_OTHER): Payer: Self-pay

## 2024-08-12 ENCOUNTER — Telehealth: Payer: Self-pay

## 2024-08-12 ENCOUNTER — Other Ambulatory Visit: Payer: Self-pay | Admitting: Pharmacist

## 2024-08-12 NOTE — Telephone Encounter (Signed)
 [facility] Patient Advocate Encounter  Patient's medications Mavyret  have been couriered to [facility] from [facility] and will be picked up on 08/12/24.  Arland Hutchinson, CPhT Specialty Pharmacy Patient [facility] for Infectious Disease Phone: [phone] Fax:  [phone]

## 2024-08-12 NOTE — Telephone Encounter (Signed)
 Patient is approved to receive Mavyret  x 8 weeks for chronic Hepatitis C infection. Counseled patient to take all three tablets of Mavyret  daily with food.  Counseled patient the need to take all three tablets together and to not separate them out during the day. Encouraged patient not to miss any doses and explained how their chance of cure could go down with each dose missed. Counseled patient on what to do if dose is missed - if it is closer to the missed dose take immediately; if closer to next dose then skip dose and take the next dose at the usual time.   Counseled patient on common side effects such as headache, fatigue, and nausea and that these normally decrease with time. I reviewed patient medications and found no drug interactions. Discussed with patient that there are several drug interactions with Mavyret  and instructed patient to call the clinic if he wishes to start a new medication during course of therapy. Also advised patient to call if he experiences any side effects. Patient will follow-up with [facility] on 09/11/24.  Orilla Templeman L. Rufus Cypert, PharmD, BCIDP, AAHIVP, CPP Infectious Diseases Clinical Pharmacist Practitioner Clinical Pharmacist Lead, Specialty Pharmacy [facility] for Infectious Disease

## 2024-08-12 NOTE — Progress Notes (Signed)
 Specialty Pharmacy Initiation Note   SLOAN TAKAGI is a 44 y.o. male who will be followed by the specialty pharmacy service for RxSp Hepatitis C    Review of administration, indication, effectiveness, safety, potential side effects, storage/disposable, and missed dose instructions occurred today for patient's specialty medication(s) Glecaprevir -Pibrentasvir  (Mavyret )     Patient/Caregiver did not have any additional questions or concerns.   Patient's therapy is appropriate to: Initiate    Goals Addressed             This Visit's Progress    Achieve virologic cure as evidenced by SVR       Patient is initiating therapy. Patient will be evaluated at upcoming provider appointment to assess progress.      Comply with lab assessments       Patient is initiating therapy. Patient will adhere to provider and/or lab appointments.      Maintain optimal adherence to therapy       Patient is initiating therapy. Patient will work on increased adherence.        Please see counseling note in chart review.  Nayson Traweek L. Drystan Reader, PharmD, BCIDP, AAHIVP, CPP Infectious Diseases Clinical Pharmacist Practitioner Clinical Pharmacist Lead, Specialty Pharmacy [facility] for Infectious Disease

## 2024-08-15 ENCOUNTER — Other Ambulatory Visit (HOSPITAL_COMMUNITY): Payer: Self-pay

## 2024-08-27 ENCOUNTER — Ambulatory Visit: Payer: MEDICAID

## 2024-08-27 ENCOUNTER — Telehealth (HOSPITAL_BASED_OUTPATIENT_CLINIC_OR_DEPARTMENT_OTHER): Payer: Self-pay

## 2024-08-27 ENCOUNTER — Encounter (HOSPITAL_BASED_OUTPATIENT_CLINIC_OR_DEPARTMENT_OTHER): Payer: MEDICAID

## 2024-08-27 NOTE — Progress Notes (Incomplete)
 "  New Patient Pulmonology Office Visit   Subjective:  Patient ID: Jeffrey Schultz, male    DOB: 1981/01/18  MRN: [id number]  Referred by: Renato Dorothey HERO, NP  CC:  No chief complaint on file.  Discussed the use of AI scribe software for clinical note transcription with the patient, who gave verbal consent to proceed.  History of Present Illness Jeffrey Schultz is a 44 year old male with prior history of IVDU, but he has not used 3 years ago, he is on methadone, current smoker 1-1.5ppd who presents for evaluation of abnormal CT scan.  He has had a chronic persistent cough for an extended period related to smoking. 3 weeks ago, he started having streak of blood mixed with sputum, and then dark blood like 2 teaspoon, which prompting an ER visit where he had a CT scan showing RUL cavitary lesion 3.3x2.8 cm and some LAD. He was started on a seven-day course of augmentin .  He has had shortness of breath for the past month with talking and walking, with increased work of breathing. He denies asthma, COPD, or recurrent pneumonias. He notes occasional wheezing and has not used inhalers recently.  He gained about 80 pounds over three years related to decreased activity after quitting drugs. He has been abstinent from illicit drugs for nearly three years and is on methadone maintenance. He previously used IV drugs. Denied vaping or using marijuana.  He smokes 1 to 1.5 packs of cigarettes daily since age 85 and wants to quit.   He takes omeprazole  and reports no other medical conditions. He denies family history of lung cancer, fever, chills, nausea, vomiting, or recent aspiration events. He notes a non-itchy rash on his legs and feet for about six months.  ROS as above.  Allergies: Patient has no known allergies.  Current Outpatient Medications:    albuterol  (VENTOLIN  HFA) 108 (90 Base) MCG/ACT inhaler, Inhale 1-2 puffs into the lungs every 6 (six) hours as needed for wheezing or shortness of  breath., Disp: 6.7 g, Rfl: 6   amoxicillin -clavulanate (AUGMENTIN ) 875-125 MG tablet, Take 1 tablet by mouth every 12 (twelve) hours. (Patient not taking: Reported on 08/08/2024), Disp: 14 tablet, Rfl: 0   Glecaprevir -Pibrentasvir  (MAVYRET ) 100-40 MG TABS, Take 3 tablets by mouth daily with supper., Disp: 84 tablet, Rfl: 1   ibuprofen  (ADVIL ,MOTRIN ) 200 MG tablet, Take 4 tablets (800 mg total) by mouth every 6 (six) hours as needed for moderate pain., Disp: 30 tablet, Rfl: 0   lidocaine  (LIDODERM ) 5 %, Place 1 patch onto the skin daily. Remove & Discard patch within 12 hours or as directed by MD (Patient not taking: Reported on 08/08/2024), Disp: 30 patch, Rfl: 0   methadone (DOLOPHINE) 10 MG/ML solution, Take 120 mg by mouth daily., Disp: , Rfl:    mupirocin  ointment (BACTROBAN ) 2 %, Apply to affected area 2 times daily (Patient not taking: Reported on 08/08/2024), Disp: 22 g, Rfl: 0   omeprazole  (PRILOSEC) 20 MG capsule, Take 1 capsule (20 mg total) by mouth daily., Disp: 30 capsule, Rfl: 2 Past Medical History:  Diagnosis Date   Anxiety    Depression    Drug overdose    Substance abuse (HCC)    Past Surgical History:  Procedure Laterality Date   I & D EXTREMITY Left 05/13/2018   Procedure: IRRIGATION AND DEBRIDEMENT ELBOW ABSCESS;  Surgeon: Elsa Lonni SAUNDERS, MD;  Location: [facility] OR;  Service: Orthopedics;  Laterality: Left;   Family History  Problem Relation  Age of Onset   Cirrhosis Father    Liver cancer Father    Colon cancer Neg Hx    Social History   Socioeconomic History   Marital status: Single    Spouse name: Not on file   Number of children: Not on file   Years of education: Not on file   Highest education level: Not on file  Occupational History   Not on file  Tobacco Use   Smoking status: Every Day    Current packs/day: 1.00    Types: Cigarettes   Smokeless tobacco: Never   Tobacco comments:    Smokes 1ppd daily   Vaping Use   Vaping status: Never Used  Substance  and Sexual Activity   Alcohol use: No   Drug use: Not Currently    Types: Heroin, Benzodiazepines, Marijuana, IV    Comment: clean for 3 years   Sexual activity: Not on file  Other Topics Concern   Not on file  Social History Narrative   Not on file   Social Drivers of Health   Tobacco Use: High Risk (07/16/2024)   Patient History    Smoking Tobacco Use: Every Day    Smokeless Tobacco Use: Never    Passive Exposure: Not on file  Financial Resource Strain: Not on file  Food Insecurity: Not on file  Transportation Needs: Not on file  Physical Activity: Not on file  Stress: Not on file  Social Connections: Not on file  Intimate Partner Violence: Not on file  Depression (EYV7-0): Not on file  Alcohol Screen: Not on file  Housing: Not on file  Utilities: Not on file  Health Literacy: Not on file       Objective:  There were no vitals taken for this visit. Wt Readings from Last 3 Encounters:  08/08/24 211 lb 12.8 oz (96.1 kg)  07/16/24 211 lb 3.2 oz (95.8 kg)  06/25/24 208 lb 3.2 oz (94.4 kg)   BMI Readings from Last 3 Encounters:  08/08/24 34.19 kg/m  07/16/24 34.09 kg/m  06/25/24 33.60 kg/m   SpO2 Readings from Last 3 Encounters:  08/08/24 94%  06/25/24 94%  06/19/24 91%    Physical Exam Constitutional:      Appearance: Normal appearance.  HENT:     Head: Normocephalic.  Eyes:     Pupils: Pupils are equal, round, and reactive to light.  Cardiovascular:     Rate and Rhythm: Normal rate and regular rhythm.     Comments: No murmur Pulmonary:     Effort: Pulmonary effort is normal.     Breath sounds: Normal breath sounds.  Musculoskeletal:        General: Normal range of motion.  Skin:    Comments: Rash in the LE present   Neurological:     General: No focal deficit present.     Mental Status: He is alert and oriented to person, place, and time.        CTA 06/19/24 1. No evidence of pulmonary embolism. 2. Right upper lobe cavitary consolidation  measuring 3.3 x 2.8 cm with associated satellite micronodules versus consolidation. Finding concerning for infection versus malignancy. Recommend pulmonology or cardiothoracic surgery consultation. 3. Prominent right hilar and interlobar lymph nodes, upper limits of normal. Recommend additional follow up. 4. Other, non-acute and/or normal findings as above  06/19/24 - blood cultures negative.  08/08/2023 CT chest - reviewed by myself 1. Interval decrease in size of a previously seen cavitary nodule in the right upper lobe,  without complete resolution. Findings strongly favor a resolving infectious process. Suggest follow-up standard CT chest without contrast in 3-6 months to ensure continued resolution. 2. Age advanced left anterior descending coronary artery calcification. 3. No evidence of fibrotic interstitial lung disease.  Assessment & Plan:    Assessment & Plan Necrotizing pneumonia, cavitary lesion of right upper lobe with hemoptysis and shortness of breath CT scan showed cavitary lesion in the RUL. His blood cultures were negative, no leukocytosis. Drug screen only +ve for methadone. Differential includes likely infection, malignancy if unresolved with antibiotics. Hemoptysis has resolved. Denied aspiration events. - Extended antibiotics for three weeks (total of four weeks). - Ordered follow-up CT scan in six weeks to assess resolution of cavitary lesion. - Prescribed albuterol  inhaler for emergency use for wheezing or shortness of breath. - Scheduled pulmonary function test to evaluate for COPD. - Instructed to go to ER if symptoms worsen or if hemoptysis recurs. - If cavitary lesion does not resolved, plan for quantiferon, ANCA, BAL, with EBUS, TBBX.  Tobacco use disorder Chronic tobacco use disorder 1-1.5ppd. Smoking cessation crucial to prevent respiratory complications. - Advised gradual reduction of cigarette consumption by two cigarettes every two weeks. - Discussed  benefits of smoking cessation on respiratory health and overall well-being.  Obesity Increased weight 80 pounds in the last 3 years. BMI 33.  - Encouraged weight loss through diet and exercise.   No follow-ups on file.    Marny Patch, MD Pulmonary and Critical Care Medicine [facility] "

## 2024-08-29 ENCOUNTER — Other Ambulatory Visit: Payer: Self-pay

## 2024-08-30 ENCOUNTER — Other Ambulatory Visit (HOSPITAL_COMMUNITY): Payer: Self-pay

## 2024-09-03 ENCOUNTER — Other Ambulatory Visit: Payer: Self-pay

## 2024-09-03 NOTE — Progress Notes (Signed)
 Specialty Pharmacy Refill Coordination Note  Jeffrey Schultz is a 44 y.o. male contacted today regarding refills of specialty medication(s) Glecaprevir -Pibrentasvir  (Mavyret )   Patient requested Courier to Provider Office   Delivery date: 09/04/24   Verified address: [facility] [address] [place] [address]   Medication will be filled on: 09/03/24

## 2024-09-04 ENCOUNTER — Telehealth: Payer: Self-pay

## 2024-09-04 NOTE — Telephone Encounter (Signed)
 [facility] Patient Advocate Encounter  Patient's medications MAVYRET  have been couriered to [facility] from [facility] pharmacy and will be picked up at the patients appointment on 09/11/24.  Charmaine Sharps, CPhT Specialty Pharmacy Patient [facility] for Infectious Disease Phone: [phone] Fax:  [phone]

## 2024-09-11 ENCOUNTER — Ambulatory Visit: Payer: Self-pay | Admitting: Infectious Diseases

## 2024-09-12 ENCOUNTER — Ambulatory Visit: Payer: MEDICAID

## 2024-09-12 ENCOUNTER — Encounter (HOSPITAL_BASED_OUTPATIENT_CLINIC_OR_DEPARTMENT_OTHER): Payer: MEDICAID
